# Patient Record
Sex: Male | Born: 1965 | Race: White | Hispanic: No | Marital: Married | Smoking: Never smoker
Health system: Southern US, Community
[De-identification: ages and names within clinical notes are randomized; demographics above are authoritative.]

## PROBLEM LIST (undated history)

## (undated) DIAGNOSIS — C4490 Unspecified malignant neoplasm of skin, unspecified: Secondary | ICD-10-CM

## (undated) DIAGNOSIS — S0990XA Unspecified injury of head, initial encounter: Secondary | ICD-10-CM

## (undated) DIAGNOSIS — I10 Essential (primary) hypertension: Secondary | ICD-10-CM

## (undated) DIAGNOSIS — R51 Headache: Secondary | ICD-10-CM

## (undated) HISTORY — PX: MELANOMA EXCISION: SHX5266

## (undated) HISTORY — PX: ANKLE SURGERY: SHX546

## (undated) HISTORY — PX: ORIF FEMUR FRACTURE: SHX2119

## (undated) HISTORY — DX: Unspecified malignant neoplasm of skin, unspecified: C44.90

## (undated) HISTORY — PX: OTHER SURGICAL HISTORY: SHX169

## (undated) HISTORY — PX: APPENDECTOMY: SHX54

## (undated) HISTORY — PX: MANDIBLE FRACTURE SURGERY: SHX706

## (undated) HISTORY — DX: Headache: R51

## (undated) HISTORY — DX: Essential (primary) hypertension: I10

---

## 1987-09-03 DIAGNOSIS — S0990XA Unspecified injury of head, initial encounter: Secondary | ICD-10-CM

## 1987-09-03 HISTORY — DX: Unspecified injury of head, initial encounter: S09.90XA

## 1998-05-19 ENCOUNTER — Encounter: Payer: Self-pay | Admitting: Family Medicine

## 1998-05-19 ENCOUNTER — Ambulatory Visit (HOSPITAL_COMMUNITY): Admission: RE | Admit: 1998-05-19 | Discharge: 1998-05-19 | Payer: Self-pay | Admitting: Family Medicine

## 2011-01-07 ENCOUNTER — Encounter: Payer: Self-pay | Admitting: Orthopedic Surgery

## 2011-01-29 ENCOUNTER — Encounter: Payer: Self-pay | Admitting: Cardiology

## 2011-01-30 ENCOUNTER — Encounter: Payer: Self-pay | Admitting: Cardiology

## 2011-01-30 ENCOUNTER — Ambulatory Visit (INDEPENDENT_AMBULATORY_CARE_PROVIDER_SITE_OTHER): Payer: BC Managed Care – PPO | Admitting: Cardiology

## 2011-01-30 DIAGNOSIS — L989 Disorder of the skin and subcutaneous tissue, unspecified: Secondary | ICD-10-CM | POA: Insufficient documentation

## 2011-01-30 DIAGNOSIS — I10 Essential (primary) hypertension: Secondary | ICD-10-CM

## 2011-01-30 NOTE — Patient Instructions (Signed)
We will check blood work on you today.  We will schedule you for an Echocardiogram ( ultrasound test of the heart).

## 2011-01-30 NOTE — Assessment & Plan Note (Signed)
His examination today is fairly unremarkable. There may be what could be considered a splinter hemorrhage on the right middle index finger. He has no constitutional symptoms to suggest endocarditis. We will perform basic lab work today including a chemistry panel, CBC, sedimentation rate, and ANA. We will also schedule him for an echocardiogram. I will plan on seeing him back in 3 weeks for followup. If the above studies are negative we will follow him for any recurrent lesions. At this point my clinic clinical suspicion is not high enough to consider a TEE.

## 2011-01-30 NOTE — Progress Notes (Signed)
   Marcus Miles Date of Birth: [birth year]   History of Present Illness: Marcus Miles is seen at the request of Dr. Merlyn Lot for evaluation of possible cardioembolic lesions on the hand. Patient reports that approximately 6 weeks ago he noted a lesion on his right thumb that subsequently turned black. Over a three-week period he had several more lesions on his thumb and finger of a similar appearance. These subsequently peeled off. On further evaluation he was noted to have what appeared to be a splinter hemorrhage on his right middle finger. There was some concern that these lesions could be cardioembolic. Patient has had no fever or chills. He's had no recent illnesses. In general he has been in good health. He does have a history of hypertension. 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  Current Outpatient Prescriptions on File Prior to Visit  Medication Sig Dispense Refill  . aspirin 81 MG tablet Take 81 mg by mouth daily.        Marland Kitchen lisinopril-hydrochlorothiazide (PRINZIDE,ZESTORETIC) 20-12.5 MG per tablet Take 2 tablets by mouth daily.        . metoprolol (TOPROL-XL) 200 MG 24 hr tablet Take 200 mg by mouth daily.        . Naproxen Sodium (ALEVE PO) Take by mouth daily.          No Known Allergies  Past Medical History  Diagnosis Date  . Hypertension   . Headache   . Skin cancer     Past Surgical History  Procedure Date  . Orif femur fracture   . Ankle surgery   . Mandible fracture surgery   . Melanoma excision     History  Smoking status  . Never Smoker   Smokeless tobacco  . Never Used    History  Alcohol Use No    Family History  Problem Relation Age of Onset  . Diabetes    . Hypertension    . Arthritis      Review of Systems: The review of systems is positive for skin lesions as noted.  He has no arthralgias or rashes otherwise. He denies any chest pain, shortness of breath, or palpitations.All other systems were  reviewed and are negative.  Physical Exam: BP 152/100  Pulse 49  Ht 5\' 9"  (1.753 m)  Wt 249 lb 8 oz (113.172 kg)  BMI 36.84 kg/m2 He is a pleasant overweight white male in no acute distress. He is normocephalic, atraumatic. Pupils are equal round and reactive to light and accommodation. Extraocular movements are full. Oropharynx is clear. Neck is supple without JVD, adenopathy, thyromegaly, or bruits. Sclerae  are clear.lungs are clear. Cardiac exam reveals a regular rate and rhythm without gallop, murmur, or click. Abdomen is soft and nontender without hepatosplenomegaly or masses. Extremities are without edema. Examination of his hands show resolution of the previously described lesions except for a tiny pinpoint like spot on the tip of his right middle finger. There is also a single lesion beneath his right middle nail which is a brown linear mark. LABORATORY DATA: ECG today is normal.  Assessment / Plan:

## 2011-01-31 LAB — CBC WITH DIFFERENTIAL/PLATELET
Basophils Absolute: 0 10*3/uL (ref 0.0–0.1)
Basophils Relative: 0.5 % (ref 0.0–3.0)
Eosinophils Absolute: 0.2 10*3/uL (ref 0.0–0.7)
Eosinophils Relative: 3.5 % (ref 0.0–5.0)
HCT: 42.5 % (ref 39.0–52.0)
Hemoglobin: 14.3 g/dL (ref 13.0–17.0)
Lymphocytes Relative: 29.4 % (ref 12.0–46.0)
Lymphs Abs: 2 10*3/uL (ref 0.7–4.0)
MCHC: 33.6 g/dL (ref 30.0–36.0)
MCV: 89.1 fl (ref 78.0–100.0)
Monocytes Absolute: 0.7 10*3/uL (ref 0.1–1.0)
Monocytes Relative: 9.9 % (ref 3.0–12.0)
Neutro Abs: 3.9 10*3/uL (ref 1.4–7.7)
Neutrophils Relative %: 56.7 % (ref 43.0–77.0)
Platelets: 165 10*3/uL (ref 150.0–400.0)
RBC: 4.77 Mil/uL (ref 4.22–5.81)
RDW: 13.3 % (ref 11.5–14.6)
WBC: 6.8 10*3/uL (ref 4.5–10.5)

## 2011-02-01 ENCOUNTER — Other Ambulatory Visit (INDEPENDENT_AMBULATORY_CARE_PROVIDER_SITE_OTHER): Payer: BC Managed Care – PPO | Admitting: *Deleted

## 2011-02-01 DIAGNOSIS — L989 Disorder of the skin and subcutaneous tissue, unspecified: Secondary | ICD-10-CM

## 2011-02-01 DIAGNOSIS — I10 Essential (primary) hypertension: Secondary | ICD-10-CM

## 2011-02-01 DIAGNOSIS — Z79899 Other long term (current) drug therapy: Secondary | ICD-10-CM

## 2011-02-01 LAB — BASIC METABOLIC PANEL
BUN: 11 mg/dL (ref 6–23)
Potassium: 3.8 mEq/L (ref 3.5–5.3)
Sodium: 139 mEq/L (ref 135–145)

## 2011-02-01 LAB — HEPATIC FUNCTION PANEL
AST: 22 U/L (ref 0–37)
Bilirubin, Direct: 0.1 mg/dL (ref 0.0–0.3)
Total Bilirubin: 0.5 mg/dL (ref 0.3–1.2)

## 2011-02-04 LAB — ANA: Anti Nuclear Antibody(ANA): NEGATIVE

## 2011-02-08 ENCOUNTER — Ambulatory Visit (HOSPITAL_COMMUNITY): Payer: BC Managed Care – PPO | Attending: Cardiology | Admitting: Radiology

## 2011-02-08 ENCOUNTER — Telehealth: Payer: Self-pay | Admitting: *Deleted

## 2011-02-08 DIAGNOSIS — E669 Obesity, unspecified: Secondary | ICD-10-CM | POA: Insufficient documentation

## 2011-02-08 DIAGNOSIS — I079 Rheumatic tricuspid valve disease, unspecified: Secondary | ICD-10-CM | POA: Insufficient documentation

## 2011-02-08 DIAGNOSIS — I749 Embolism and thrombosis of unspecified artery: Secondary | ICD-10-CM | POA: Insufficient documentation

## 2011-02-08 DIAGNOSIS — I10 Essential (primary) hypertension: Secondary | ICD-10-CM | POA: Insufficient documentation

## 2011-02-08 DIAGNOSIS — G459 Transient cerebral ischemic attack, unspecified: Secondary | ICD-10-CM

## 2011-02-08 NOTE — Telephone Encounter (Signed)
Message copied by Lorayne Bender on Fri Feb 08, 2011  2:30 PM ------      Message from: [place], PETER M      Created: Wed Feb 06, 2011  5:27 PM       ANA and chemistries are all normal. Please report.

## 2011-02-08 NOTE — Telephone Encounter (Signed)
Lm w/ lab results. Advised to call back

## 2011-02-11 ENCOUNTER — Telehealth: Payer: Self-pay | Admitting: *Deleted

## 2011-02-11 NOTE — Telephone Encounter (Signed)
Notified of Echo and all lab results. States he is feeling better and canc app in f/u w/ Dr. [place]

## 2011-02-11 NOTE — Telephone Encounter (Signed)
Message copied by Lorayne Bender on Mon Feb 11, 2011  4:46 PM ------      Message from: [place], PETER M      Created: Sat Feb 09, 2011  5:10 PM       Please report LV function is still good. 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

## 2011-02-21 ENCOUNTER — Ambulatory Visit: Payer: BC Managed Care – PPO | Admitting: Cardiology

## 2011-09-06 ENCOUNTER — Ambulatory Visit (INDEPENDENT_AMBULATORY_CARE_PROVIDER_SITE_OTHER): Payer: BC Managed Care – PPO | Admitting: General Surgery

## 2011-09-30 ENCOUNTER — Ambulatory Visit (INDEPENDENT_AMBULATORY_CARE_PROVIDER_SITE_OTHER): Payer: BC Managed Care – PPO | Admitting: General Surgery

## 2011-09-30 ENCOUNTER — Encounter (INDEPENDENT_AMBULATORY_CARE_PROVIDER_SITE_OTHER): Payer: Self-pay | Admitting: General Surgery

## 2011-09-30 DIAGNOSIS — E669 Obesity, unspecified: Secondary | ICD-10-CM | POA: Insufficient documentation
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
## 2011-09-30 NOTE — Patient Instructions (Signed)
We will start your work-up for gastric bypass surgery

## 2011-10-01 LAB — CBC WITH DIFFERENTIAL/PLATELET
HCT: 44.5 % (ref 39.0–52.0)
Hemoglobin: 14.2 g/dL (ref 13.0–17.0)
Lymphocytes Relative: 26 % (ref 12–46)
Lymphs Abs: 2 10*3/uL (ref 0.7–4.0)
MCHC: 31.9 g/dL (ref 30.0–36.0)
Monocytes Absolute: 0.7 10*3/uL (ref 0.1–1.0)
Monocytes Relative: 9 % (ref 3–12)
Neutro Abs: 4.8 10*3/uL (ref 1.7–7.7)
RBC: 4.99 MIL/uL (ref 4.22–5.81)

## 2011-10-01 LAB — COMPREHENSIVE METABOLIC PANEL
Albumin: 4.7 g/dL (ref 3.5–5.2)
BUN: 14 mg/dL (ref 6–23)
CO2: 33 mEq/L — ABNORMAL HIGH (ref 19–32)
Calcium: 9.2 mg/dL (ref 8.4–10.5)
Chloride: 100 mEq/L (ref 96–112)
Glucose, Bld: 101 mg/dL — ABNORMAL HIGH (ref 70–99)
Potassium: 4.5 mEq/L (ref 3.5–5.3)

## 2011-10-01 LAB — LIPID PANEL
HDL: 40 mg/dL (ref >39)
Triglycerides: 148 mg/dL (ref <150)

## 2011-10-10 ENCOUNTER — Ambulatory Visit (HOSPITAL_COMMUNITY)
Admission: RE | Admit: 2011-10-10 | Discharge: 2011-10-10 | Disposition: A | Discharge status: Home / Self Care | Payer: BC Managed Care – PPO | Source: Ambulatory Visit | Attending: General Surgery | Admitting: General Surgery

## 2011-10-10 ENCOUNTER — Other Ambulatory Visit: Payer: Self-pay

## 2011-10-10 DIAGNOSIS — I10 Essential (primary) hypertension: Secondary | ICD-10-CM | POA: Insufficient documentation

## 2011-10-10 DIAGNOSIS — K8020 Calculus of gallbladder without cholecystitis without obstruction: Secondary | ICD-10-CM | POA: Insufficient documentation

## 2011-10-10 DIAGNOSIS — R059 Cough, unspecified: Secondary | ICD-10-CM | POA: Insufficient documentation

## 2011-10-10 DIAGNOSIS — Z6838 Body mass index (BMI) 38.0-38.9, adult: Secondary | ICD-10-CM | POA: Insufficient documentation

## 2011-10-10 DIAGNOSIS — K7689 Other specified diseases of liver: Secondary | ICD-10-CM | POA: Insufficient documentation

## 2011-10-10 DIAGNOSIS — R05 Cough: Secondary | ICD-10-CM | POA: Insufficient documentation

## 2011-10-14 ENCOUNTER — Ambulatory Visit: Payer: BC Managed Care – PPO | Admitting: *Deleted

## 2011-10-15 ENCOUNTER — Ambulatory Visit (HOSPITAL_COMMUNITY): Payer: BC Managed Care – PPO

## 2011-10-21 ENCOUNTER — Ambulatory Visit: Payer: BC Managed Care – PPO | Admitting: *Deleted

## 2011-10-21 ENCOUNTER — Encounter (HOSPITAL_COMMUNITY): Admission: RE | Payer: Self-pay | Source: Ambulatory Visit

## 2011-10-21 ENCOUNTER — Ambulatory Visit (HOSPITAL_COMMUNITY): Admission: RE | Admit: 2011-10-21 | Payer: BC Managed Care – PPO | Source: Ambulatory Visit | Admitting: General Surgery

## 2011-10-21 SURGERY — BREATH TEST, FOR HELICOBACTER PYLORI

## 2011-11-06 ENCOUNTER — Encounter: Payer: Self-pay | Admitting: *Deleted

## 2011-11-06 ENCOUNTER — Encounter: Payer: BC Managed Care – PPO | Attending: General Surgery | Admitting: *Deleted

## 2011-11-06 DIAGNOSIS — Z713 Dietary counseling and surveillance: Secondary | ICD-10-CM | POA: Insufficient documentation

## 2011-11-06 DIAGNOSIS — Z01818 Encounter for other preprocedural examination: Secondary | ICD-10-CM | POA: Insufficient documentation

## 2011-11-06 NOTE — Patient Instructions (Addendum)
   Follow Pre-Op Nutrition Goals to prepare for Gastric Bypass Surgery.   Call the [facility] at [phone] once you have been given your surgery date to enrolled in the Pre-Op Nutrition Class. You will need to attend this nutrition class 3-4 weeks prior to your surgery. 

## 2011-11-06 NOTE — Progress Notes (Signed)
  Pre-Op Assessment Visit:  Pre-Operative Gastric Bypass Surgery  Medical Nutrition Therapy:  Appt start time: 1600 end time:  1700.  Patient was seen on 11/06/2011 for Pre-Operative Gastric Bypass/RNY Nutrition Assessment. Assessment and letter of approval faxed to [facility] Bariatric Surgery Program coordinator on 11/07/11.  Approval letter sent to [facility] center and will be available in the chart under the media tab.  Handouts given during visit include:  Pre-Op Goals Handout  Patient to call for Pre-Op and Post-Op Nutrition Education at the [facility] when surgery is scheduled.

## 2011-11-27 ENCOUNTER — Other Ambulatory Visit (INDEPENDENT_AMBULATORY_CARE_PROVIDER_SITE_OTHER): Payer: Self-pay | Admitting: General Surgery

## 2011-12-11 ENCOUNTER — Ambulatory Visit (HOSPITAL_COMMUNITY)
Admission: RE | Admit: 2011-12-11 | Discharge: 2011-12-11 | Disposition: A | Discharge status: Home Health | Payer: BC Managed Care – PPO | Source: Ambulatory Visit | Attending: General Surgery | Admitting: General Surgery

## 2011-12-11 ENCOUNTER — Encounter (HOSPITAL_COMMUNITY)
Admission: RE | Disposition: A | Discharge status: Home Health | Payer: Self-pay | Source: Ambulatory Visit | Attending: General Surgery

## 2011-12-11 HISTORY — PX: BREATH TEK H PYLORI: SHX5422

## 2011-12-11 SURGERY — BREATH TEST, FOR HELICOBACTER PYLORI

## 2011-12-13 ENCOUNTER — Encounter (HOSPITAL_COMMUNITY): Payer: Self-pay | Admitting: General Surgery

## 2012-06-18 ENCOUNTER — Other Ambulatory Visit (INDEPENDENT_AMBULATORY_CARE_PROVIDER_SITE_OTHER): Payer: Self-pay | Admitting: General Surgery

## 2012-06-19 ENCOUNTER — Telehealth (INDEPENDENT_AMBULATORY_CARE_PROVIDER_SITE_OTHER): Payer: Self-pay | Admitting: General Surgery

## 2012-06-19 NOTE — Telephone Encounter (Signed)
The patient called back and I told him the message that [facility] had left.  I told him to stop Aspirin and Aleve 7 days prior.  He states he has a surgery date of 12/16.  Please call if there is any further message.

## 2012-06-19 NOTE — Telephone Encounter (Signed)
Calling to make patient aware Dr Andrey Campanile has filled out orders for his gastric bypass surgery and for him to stop aspirin/blood thinners for 7 days prior. Left message on machine for patient to call back and ask for me.

## 2012-07-23 ENCOUNTER — Ambulatory Visit (INDEPENDENT_AMBULATORY_CARE_PROVIDER_SITE_OTHER): Payer: BC Managed Care – PPO | Admitting: General Surgery

## 2012-08-06 ENCOUNTER — Encounter: Payer: Self-pay | Admitting: *Deleted

## 2012-08-06 ENCOUNTER — Encounter: Payer: BC Managed Care – PPO | Attending: General Surgery | Admitting: *Deleted

## 2012-08-06 VITALS — Ht 69.0 in | Wt 257.7 lb

## 2012-08-06 DIAGNOSIS — E669 Obesity, unspecified: Secondary | ICD-10-CM | POA: Insufficient documentation

## 2012-08-06 DIAGNOSIS — Z01818 Encounter for other preprocedural examination: Secondary | ICD-10-CM | POA: Insufficient documentation

## 2012-08-06 DIAGNOSIS — Z713 Dietary counseling and surveillance: Secondary | ICD-10-CM | POA: Insufficient documentation

## 2012-08-06 NOTE — Patient Instructions (Signed)
Follow:   Pre-Op Diet per MD 2 weeks prior to surgery  Phase 2- Liquids (clear/full) 2 weeks after surgery  Vitamin/Mineral/Calcium guidelines for purchasing bariatric supplements  Exercise guidelines pre and post-op per MD  Follow-up at [facility] in 2 weeks post-op for diet advancement. Contact Syrena Burges as needed with questions/concerns. 

## 2012-08-06 NOTE — Progress Notes (Signed)
Bariatric Class:  Appt start time: 1730 end time:  1830.  Pre-Operative Nutrition Class  Patient was seen on 08/06/2012 for Pre-Operative Bariatric Surgery Education at [facility].   Surgery date: 08/17/12 Surgery type: RYGB Start weight at [facility]: 258.4 lbs (11/06/11) Goal weight: 190 lbs  Weight today: 257.7 lbs BMI: 38.1  Samples given per MNT protocol: Bariatric Advantage Multivitamin Lot # [id number];  Exp: 06/15  Bariatric Advantage Calcium Citrate Lot # [id number];  Exp: 12/13 (*Pt alerted to upcoming expiration date)  Celebrate Vitamins Multivitamin Lot # [id number];  Exp: 07/14  Celebrate Vitamins Iron + C (18 mg) Lot # [id number];  Exp: 03/15  Celebrate Vitamins Calcium Citrate Lot # [id number];  Exp: 08/15  Corliss Marcus Protein Shake Lot # [id number];  Exp: 02/15  Premier Protein Shake Lot # [id number];  Exp: 06/20/13  The following the learning objective met by the patient during this course:  Identifies Pre-Op Dietary Goals and will begin 2 weeks pre-operatively  Identifies appropriate sources of fluids and proteins   States protein recommendations and appropriate sources pre and post-operatively  Identifies Post-Operative Dietary Goals and will follow for 2 weeks post-operatively  Identifies appropriate multivitamin and calcium sources  Describes the need for physical activity post-operatively and will follow MD recommendations  States when to call healthcare provider regarding medication questions or post-operative complications  Handouts given during class include:  Pre-Op Bariatric Surgery Diet Handout  Protein Shake Handout  Post-Op Bariatric Surgery Nutrition Handout  Support Group Information Flyer  [facility] Bariatric Supplements Price List  Follow-Up Plan: Patient will follow-up at [facility] 2 weeks post operatively for diet advancement per MD.

## 2012-08-07 ENCOUNTER — Encounter (HOSPITAL_COMMUNITY): Payer: Self-pay | Admitting: Pharmacy Technician

## 2012-08-12 NOTE — Progress Notes (Signed)
Chest x ray  2/13 EPIC, EKG 1/13 EPIC

## 2012-08-12 NOTE — Patient Instructions (Addendum)
20 Marcus Miles  08/12/2012   Your procedure is scheduled on: 08/17/12  MONDAY    Report to [facility] at   0515    AM.  Call this number if you have problems the morning of surgery: [phone]       Remember:   Do not eat food  Or drink :After Midnight.   Take these medicines the morning of surgery with A SIP OF WATER:metoprolol   .  Contacts, dentures or partial plates can not be worn to surgery  Leave suitcase in the car. After surgery it may be brought to your room.  For patients admitted to the hospital, checkout time is 11:00 AM day of  discharge.             SPECIAL INSTRUCTIONS- SEE [facility] PREPARING FOR SURGERY INSTRUCTION SHEET-     DO NOT WEAR JEWELRY, LOTIONS, POWDERS, OR PERFUMES.  WOMEN-- DO NOT SHAVE LEGS OR UNDERARMS FOR 12 HOURS BEFORE SHOWERS. MEN MAY SHAVE FACE.  Patients discharged the day of surgery will not be allowed to drive home. IF going home the day of surgery, you must have a driver and someone to stay with you for the first 24 hours  Name and phone number of your driver:  admission                                                                      Please read over the following fact sheets that you were given: MRSA Information, Incentive Spirometry Sheet, Blood Transfusion Sheet  Information                                                                                   Cain Sieve RN  [facility]ST phone number [phone]                 FAILURE TO FOLLOW THESE INSTRUCTIONS MAY RESULT IN  CANCELLATION   OF YOUR SURGERY                                                  Patient Signature _____________________________

## 2012-08-13 ENCOUNTER — Ambulatory Visit (INDEPENDENT_AMBULATORY_CARE_PROVIDER_SITE_OTHER): Payer: BC Managed Care – PPO | Admitting: General Surgery

## 2012-08-13 ENCOUNTER — Encounter (INDEPENDENT_AMBULATORY_CARE_PROVIDER_SITE_OTHER): Payer: Self-pay | Admitting: General Surgery

## 2012-08-13 ENCOUNTER — Inpatient Hospital Stay (HOSPITAL_COMMUNITY): Admission: RE | Admit: 2012-08-13 | Payer: BC Managed Care – PPO | Source: Ambulatory Visit

## 2012-08-13 ENCOUNTER — Encounter (HOSPITAL_COMMUNITY)
Admission: RE | Admit: 2012-08-13 | Discharge: 2012-08-13 | Disposition: A | Discharge status: Home / Self Care | Payer: BC Managed Care – PPO | Source: Ambulatory Visit | Attending: General Surgery | Admitting: General Surgery

## 2012-08-13 ENCOUNTER — Encounter (HOSPITAL_COMMUNITY): Payer: Self-pay

## 2012-08-13 VITALS — BP 138/66 | HR 64 | Temp 97.2°F | Resp 16 | Ht 69.5 in | Wt 254.2 lb

## 2012-08-13 DIAGNOSIS — Z6836 Body mass index (BMI) 36.0-36.9, adult: Secondary | ICD-10-CM

## 2012-08-13 DIAGNOSIS — E669 Obesity, unspecified: Secondary | ICD-10-CM

## 2012-08-13 DIAGNOSIS — I10 Essential (primary) hypertension: Secondary | ICD-10-CM

## 2012-08-13 DIAGNOSIS — R51 Headache: Secondary | ICD-10-CM

## 2012-08-13 HISTORY — DX: Unspecified injury of head, initial encounter: S09.90XA

## 2012-08-13 LAB — COMPREHENSIVE METABOLIC PANEL
ALT: 30 U/L (ref 0–53)
AST: 26 U/L (ref 0–37)
Alkaline Phosphatase: 58 U/L (ref 39–117)
CO2: 30 mEq/L (ref 19–32)
Chloride: 100 mEq/L (ref 96–112)
GFR calc non Af Amer: >90 mL/min (ref >90)
Glucose, Bld: 92 mg/dL (ref 70–99)
Potassium: 3.9 mEq/L (ref 3.5–5.1)
Sodium: 140 mEq/L (ref 135–145)
Total Bilirubin: 0.4 mg/dL (ref 0.3–1.2)

## 2012-08-13 LAB — CBC WITH DIFFERENTIAL/PLATELET
Basophils Absolute: 0 10*3/uL (ref 0.0–0.1)
Lymphocytes Relative: 28 % (ref 12–46)
Lymphs Abs: 2.2 10*3/uL (ref 0.7–4.0)
Neutro Abs: 4.5 10*3/uL (ref 1.7–7.7)
Platelets: 184 10*3/uL (ref 150–400)
RBC: 4.88 MIL/uL (ref 4.22–5.81)
RDW: 12.9 % (ref 11.5–15.5)
WBC: 7.7 10*3/uL (ref 4.0–10.5)

## 2012-08-13 MED ORDER — PEG-KCL-NACL-NASULF-NA ASC-C 100 G PO SOLR: 1.0000 | Freq: Once | ORAL | Status: DC

## 2012-08-13 NOTE — Patient Instructions (Signed)
Pick up bowel prep from pharmacy - call if there is a problem with it
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
## 2012-08-17 ENCOUNTER — Encounter (HOSPITAL_COMMUNITY)
Admission: RE | Disposition: A | Discharge status: Home / Self Care | Payer: Self-pay | Source: Ambulatory Visit | Attending: General Surgery

## 2012-08-17 ENCOUNTER — Encounter (HOSPITAL_COMMUNITY): Payer: Self-pay | Admitting: Anesthesiology

## 2012-08-17 ENCOUNTER — Inpatient Hospital Stay (HOSPITAL_COMMUNITY)
Admission: RE | Admit: 2012-08-17 | Discharge: 2012-08-19 | DRG: 288 | Disposition: A | Discharge status: Home / Self Care | Payer: BC Managed Care – PPO | Source: Ambulatory Visit | Attending: General Surgery | Admitting: General Surgery

## 2012-08-17 ENCOUNTER — Encounter (HOSPITAL_COMMUNITY): Payer: Self-pay | Admitting: Orthopedic Surgery

## 2012-08-17 ENCOUNTER — Inpatient Hospital Stay (HOSPITAL_COMMUNITY): Payer: BC Managed Care – PPO | Admitting: Anesthesiology

## 2012-08-17 ENCOUNTER — Encounter (HOSPITAL_COMMUNITY): Payer: Self-pay

## 2012-08-17 DIAGNOSIS — Z9884 Bariatric surgery status: Secondary | ICD-10-CM

## 2012-08-17 DIAGNOSIS — Z6836 Body mass index (BMI) 36.0-36.9, adult: Secondary | ICD-10-CM

## 2012-08-17 DIAGNOSIS — Z6837 Body mass index (BMI) 37.0-37.9, adult: Secondary | ICD-10-CM

## 2012-08-17 DIAGNOSIS — I10 Essential (primary) hypertension: Secondary | ICD-10-CM | POA: Diagnosis present

## 2012-08-17 DIAGNOSIS — Z01812 Encounter for preprocedural laboratory examination: Secondary | ICD-10-CM

## 2012-08-17 DIAGNOSIS — E7800 Pure hypercholesterolemia, unspecified: Secondary | ICD-10-CM | POA: Diagnosis present

## 2012-08-17 DIAGNOSIS — G4733 Obstructive sleep apnea (adult) (pediatric): Secondary | ICD-10-CM

## 2012-08-17 DIAGNOSIS — K7689 Other specified diseases of liver: Secondary | ICD-10-CM | POA: Diagnosis present

## 2012-08-17 DIAGNOSIS — E669 Obesity, unspecified: Secondary | ICD-10-CM | POA: Diagnosis present

## 2012-08-17 DIAGNOSIS — Z79899 Other long term (current) drug therapy: Secondary | ICD-10-CM

## 2012-08-17 HISTORY — PX: UPPER GI ENDOSCOPY: SHX6162

## 2012-08-17 HISTORY — PX: GASTRIC ROUX-EN-Y: SHX5262

## 2012-08-17 LAB — CBC
HCT: 40.8 % (ref 39.0–52.0)
Hemoglobin: 13.7 g/dL (ref 13.0–17.0)
MCV: 86.3 fL (ref 78.0–100.0)
RBC: 4.73 MIL/uL (ref 4.22–5.81)
RDW: 12.8 % (ref 11.5–15.5)
WBC: 10.3 10*3/uL (ref 4.0–10.5)

## 2012-08-17 LAB — CREATININE, SERUM
GFR calc Af Amer: >90 mL/min (ref >90)
GFR calc non Af Amer: 83 mL/min — ABNORMAL LOW (ref >90)

## 2012-08-17 SURGERY — LAPAROSCOPIC ROUX-EN-Y GASTRIC
Anesthesia: General | Site: Abdomen | Wound class: Clean Contaminated

## 2012-08-17 MED ORDER — NEOSTIGMINE METHYLSULFATE 1 MG/ML IJ SOLN
INTRAMUSCULAR | Status: DC | PRN
  Administered 2012-08-17: 1 mg via INTRAVENOUS

## 2012-08-17 MED ORDER — FENTANYL CITRATE 0.05 MG/ML IJ SOLN
INTRAMUSCULAR | Status: DC | PRN
  Administered 2012-08-17 (×3): 50 ug via INTRAVENOUS
  Administered 2012-08-17: 100 ug via INTRAVENOUS

## 2012-08-17 MED ORDER — MEPERIDINE HCL 50 MG/ML IJ SOLN: 6.2500 mg | INTRAMUSCULAR | Status: DC | PRN

## 2012-08-17 MED ORDER — HYDROMORPHONE HCL PF 1 MG/ML IJ SOLN
0.2500 mg | INTRAMUSCULAR | Status: DC | PRN
  Administered 2012-08-17 (×2): 0.5 mg via INTRAVENOUS

## 2012-08-17 MED ORDER — PROPOFOL 10 MG/ML IV BOLUS
INTRAVENOUS | Status: DC | PRN
  Administered 2012-08-17: 200 mg via INTRAVENOUS

## 2012-08-17 MED ORDER — OXYCODONE HCL 5 MG/5ML PO SOLN
5.0000 mg | Freq: Once | ORAL | Status: DC | PRN
  Filled 2012-08-17: qty 5

## 2012-08-17 MED ORDER — INFLUENZA VIRUS VACC SPLIT PF IM SUSP
0.5000 mL | INTRAMUSCULAR | Status: AC
  Administered 2012-08-19: 0.5 mL via INTRAMUSCULAR
  Filled 2012-08-17: qty 0.5

## 2012-08-17 MED ORDER — ONDANSETRON HCL 4 MG/2ML IJ SOLN
4.0000 mg | INTRAMUSCULAR | Status: DC | PRN
  Administered 2012-08-17 – 2012-08-18 (×2): 4 mg via INTRAVENOUS
  Filled 2012-08-17 (×2): qty 2

## 2012-08-17 MED ORDER — GLYCOPYRROLATE 0.2 MG/ML IJ SOLN
INTRAMUSCULAR | Status: DC | PRN
  Administered 2012-08-17: .2 mg via INTRAVENOUS

## 2012-08-17 MED ORDER — ACETAMINOPHEN 160 MG/5ML PO SOLN: 650.0000 mg | ORAL | Status: DC | PRN

## 2012-08-17 MED ORDER — LIDOCAINE HCL (CARDIAC) 20 MG/ML IV SOLN
INTRAVENOUS | Status: DC | PRN
  Administered 2012-08-17: 100 mg via INTRAVENOUS

## 2012-08-17 MED ORDER — UNJURY VANILLA POWDER
2.0000 [oz_av] | Freq: Four times a day (QID) | ORAL | Status: DC
  Administered 2012-08-19: 2 [oz_av] via ORAL

## 2012-08-17 MED ORDER — ACETAMINOPHEN 10 MG/ML IV SOLN: 1000.0000 mg | Freq: Once | INTRAVENOUS | Status: DC | PRN

## 2012-08-17 MED ORDER — ACETAMINOPHEN 10 MG/ML IV SOLN
1000.0000 mg | Freq: Four times a day (QID) | INTRAVENOUS | Status: AC
  Administered 2012-08-17 – 2012-08-18 (×4): 1000 mg via INTRAVENOUS
  Filled 2012-08-17 (×6): qty 100

## 2012-08-17 MED ORDER — OXYCODONE-ACETAMINOPHEN 5-325 MG/5ML PO SOLN
5.0000 mL | ORAL | Status: DC | PRN
  Administered 2012-08-18 – 2012-08-19 (×4): 10 mL via ORAL
  Filled 2012-08-17 (×4): qty 10

## 2012-08-17 MED ORDER — PROMETHAZINE HCL 25 MG/ML IJ SOLN
6.2500 mg | INTRAMUSCULAR | Status: DC | PRN
  Administered 2012-08-17: 6.25 mg via INTRAVENOUS

## 2012-08-17 MED ORDER — UNJURY CHICKEN SOUP POWDER: 2.0000 [oz_av] | Freq: Four times a day (QID) | ORAL | Status: DC

## 2012-08-17 MED ORDER — MORPHINE SULFATE 2 MG/ML IJ SOLN
2.0000 mg | INTRAMUSCULAR | Status: DC | PRN
  Administered 2012-08-17 – 2012-08-18 (×7): 2 mg via INTRAVENOUS
  Filled 2012-08-17 (×7): qty 1

## 2012-08-17 MED ORDER — UNJURY CHOCOLATE CLASSIC POWDER
2.0000 [oz_av] | Freq: Four times a day (QID) | ORAL | Status: DC
Start: 2012-08-19 — End: 2012-08-19

## 2012-08-17 MED ORDER — HEPARIN SODIUM (PORCINE) 5000 UNIT/ML IJ SOLN
5000.0000 [IU] | INTRAMUSCULAR | Status: AC
  Administered 2012-08-17: 5000 [IU] via SUBCUTANEOUS
  Filled 2012-08-17: qty 1

## 2012-08-17 MED ORDER — BUPIVACAINE-EPINEPHRINE 0.25% -1:200000 IJ SOLN
INTRAMUSCULAR | Status: DC | PRN
  Administered 2012-08-17: 20 mL

## 2012-08-17 MED ORDER — KCL IN DEXTROSE-NACL 20-5-0.45 MEQ/L-%-% IV SOLN
INTRAVENOUS | Status: DC
  Administered 2012-08-17 – 2012-08-19 (×6): via INTRAVENOUS
  Filled 2012-08-17 (×8): qty 1000

## 2012-08-17 MED ORDER — DEXTROSE 5 % IV SOLN
2.0000 g | INTRAVENOUS | Status: AC
  Administered 2012-08-17: 2 g via INTRAVENOUS
  Filled 2012-08-17: qty 2

## 2012-08-17 MED ORDER — ACETAMINOPHEN 10 MG/ML IV SOLN
INTRAVENOUS | Status: DC | PRN
  Administered 2012-08-17: 1000 mg via INTRAVENOUS

## 2012-08-17 MED ORDER — LACTATED RINGERS IR SOLN
Status: DC | PRN
  Administered 2012-08-17: 3000 mL

## 2012-08-17 MED ORDER — CISATRACURIUM BESYLATE (PF) 10 MG/5ML IV SOLN
INTRAVENOUS | Status: DC | PRN
  Administered 2012-08-17 (×3): 2 mg via INTRAVENOUS
  Administered 2012-08-17: 12 mg via INTRAVENOUS
  Administered 2012-08-17 (×2): 2 mg via INTRAVENOUS

## 2012-08-17 MED ORDER — OXYCODONE HCL 5 MG PO TABS: 5.0000 mg | ORAL_TABLET | Freq: Once | ORAL | Status: DC | PRN

## 2012-08-17 MED ORDER — ENOXAPARIN SODIUM 40 MG/0.4ML ~~LOC~~ SOLN
40.0000 mg | SUBCUTANEOUS | Status: DC
  Administered 2012-08-18: 40 mg via SUBCUTANEOUS
  Filled 2012-08-17 (×3): qty 0.4

## 2012-08-17 MED ORDER — TISSEEL VH 10 ML EX KIT
PACK | CUTANEOUS | Status: DC | PRN
  Administered 2012-08-17 (×2): 10 mL

## 2012-08-17 MED ORDER — EPHEDRINE SULFATE 50 MG/ML IJ SOLN
INTRAMUSCULAR | Status: DC | PRN
  Administered 2012-08-17 (×2): 5 mg via INTRAVENOUS
  Administered 2012-08-17 (×2): 10 mg via INTRAVENOUS

## 2012-08-17 MED ORDER — METOPROLOL TARTRATE 1 MG/ML IV SOLN
5.0000 mg | Freq: Four times a day (QID) | INTRAVENOUS | Status: DC
  Administered 2012-08-17 – 2012-08-19 (×8): 5 mg via INTRAVENOUS
  Filled 2012-08-17 (×12): qty 5

## 2012-08-17 MED ORDER — LACTATED RINGERS IV SOLN
INTRAVENOUS | Status: DC | PRN
  Administered 2012-08-17 (×3): via INTRAVENOUS

## 2012-08-17 MED ORDER — ONDANSETRON HCL 4 MG/2ML IJ SOLN
INTRAMUSCULAR | Status: DC | PRN
  Administered 2012-08-17 (×2): 2 mg via INTRAVENOUS

## 2012-08-17 MED ORDER — MIDAZOLAM HCL 5 MG/5ML IJ SOLN
INTRAMUSCULAR | Status: DC | PRN
  Administered 2012-08-17: 2 mg via INTRAVENOUS

## 2012-08-17 SURGICAL SUPPLY — 88 items
ADH SKN CLS APL DERMABOND .7 (GAUZE/BANDAGES/DRESSINGS) ×2
APL SKNCLS STERI-STRIP NONHPOA (GAUZE/BANDAGES/DRESSINGS)
APL SRG 32X5 SNPLK LF DISP (MISCELLANEOUS) ×2
APPLICATOR COTTON TIP 6IN STRL (MISCELLANEOUS) ×6 IMPLANT
APPLIER CLIP ROT 13.4 12 LRG (CLIP)
APR CLP LRG 13.4X12 ROT 20 MLT (CLIP)
BAG SPEC RTRVL LRG 6X4 10 (ENDOMECHANICALS)
BENZOIN TINCTURE PRP APPL 2/3 (GAUZE/BANDAGES/DRESSINGS) IMPLANT
BLADE SURG 15 STRL LF DISP TIS (BLADE) IMPLANT
BLADE SURG 15 STRL SS (BLADE) ×3
BLADE SURG SZ11 CARB STEEL (BLADE) ×3 IMPLANT
CABLE HIGH FREQUENCY MONO STRZ (ELECTRODE) ×3 IMPLANT
CANISTER SUCTION 2500CC (MISCELLANEOUS) ×3 IMPLANT
CLIP APPLIE ROT 13.4 12 LRG (CLIP) IMPLANT
CLIP SUT LAPRA TY ABSORB (SUTURE) ×6 IMPLANT
CLOTH BEACON ORANGE TIMEOUT ST (SAFETY) ×3 IMPLANT
COVER SURGICAL LIGHT HANDLE (MISCELLANEOUS) ×3 IMPLANT
CUTTER LINEAR ENDO ART 45 ETS (STAPLE) ×1 IMPLANT
DERMABOND ADVANCED (GAUZE/BANDAGES/DRESSINGS) ×1
DERMABOND ADVANCED .7 DNX12 (GAUZE/BANDAGES/DRESSINGS) IMPLANT
DEVICE SUTURE ENDOST 10MM (ENDOMECHANICALS) ×3 IMPLANT
DISSECTOR BLUNT TIP ENDO 5MM (MISCELLANEOUS) IMPLANT
DRAIN PENROSE 18X1/4 LTX STRL (WOUND CARE) ×3 IMPLANT
DRAPE CAMERA CLOSED 9X96 (DRAPES) ×3 IMPLANT
DRAPE UTILITY XL STRL (DRAPES) ×3 IMPLANT
ELECT REM PT RETURN 9FT ADLT (ELECTROSURGICAL) ×3
ELECTRODE REM PT RTRN 9FT ADLT (ELECTROSURGICAL) ×2 IMPLANT
GAUZE SPONGE 4X4 16PLY XRAY LF (GAUZE/BANDAGES/DRESSINGS) ×3 IMPLANT
GLOVE BIO SURGEON STRL SZ7.5 (GLOVE) ×4 IMPLANT
GLOVE BIOGEL M STRL SZ7.5 (GLOVE) IMPLANT
GLOVE BIOGEL PI IND STRL 7.0 (GLOVE) ×2 IMPLANT
GLOVE BIOGEL PI INDICATOR 7.0 (GLOVE) ×1
GLOVE INDICATOR 8.0 STRL GRN (GLOVE) ×3 IMPLANT
GOWN STRL NON-REIN LRG LVL3 (GOWN DISPOSABLE) ×4 IMPLANT
GOWN STRL REIN XL XLG (GOWN DISPOSABLE) ×6 IMPLANT
HEMOSTAT SURGICEL 4X8 (HEMOSTASIS) IMPLANT
HOVERMATT SINGLE USE (MISCELLANEOUS) ×3 IMPLANT
KIT BASIN OR (CUSTOM PROCEDURE TRAY) ×3 IMPLANT
KIT GASTRIC LAVAGE 34FR ADT (SET/KITS/TRAYS/PACK) ×3 IMPLANT
MARKER SKIN DUAL TIP RULER LAB (MISCELLANEOUS) ×3 IMPLANT
NDL SPNL 22GX3.5 QUINCKE BK (NEEDLE) ×2 IMPLANT
NEEDLE SPNL 22GX3.5 QUINCKE BK (NEEDLE) ×3 IMPLANT
NS IRRIG 1000ML POUR BTL (IV SOLUTION) ×3 IMPLANT
PACK CARDIOVASCULAR III (CUSTOM PROCEDURE TRAY) ×3 IMPLANT
POUCH SPECIMEN RETRIEVAL 10MM (ENDOMECHANICALS) IMPLANT
RELOAD 45 VASCULAR/THIN (ENDOMECHANICALS) ×3 IMPLANT
RELOAD BLUE (STAPLE) ×2 IMPLANT
RELOAD ENDO STITCH 2.0 (ENDOMECHANICALS) ×30
RELOAD GOLD (STAPLE) ×1 IMPLANT
RELOAD STAPLE 45 2.5 WHT GRN (ENDOMECHANICALS) IMPLANT
RELOAD STAPLE 45 3.5 BLU ETS (ENDOMECHANICALS) IMPLANT
RELOAD STAPLE TA45 3.5 REG BLU (ENDOMECHANICALS) ×6 IMPLANT
RELOAD SUT SNGL STCH ABSRB 2-0 (ENDOMECHANICALS) IMPLANT
RELOAD SUT SNGL STCH BLK 2-0 (ENDOMECHANICALS) IMPLANT
RELOAD WHITE ECR60W (STAPLE) ×1 IMPLANT
SCALPEL HARMONIC ACE (MISCELLANEOUS) ×3 IMPLANT
SCISSORS LAP 5X35 DISP (ENDOMECHANICALS) ×3 IMPLANT
SEALANT SURGICAL APPL DUAL CAN (MISCELLANEOUS) ×3 IMPLANT
SET IRRIG TUBING LAPAROSCOPIC (IRRIGATION / IRRIGATOR) ×3 IMPLANT
SLEEVE ADV FIXATION 12X100MM (TROCAR) ×6 IMPLANT
SLEEVE ADV FIXATION 5X100MM (TROCAR) ×3 IMPLANT
SLEEVE ENDOPATH XCEL 5M (ENDOMECHANICALS) ×3 IMPLANT
SOLUTION ANTI FOG 6CC (MISCELLANEOUS) ×3 IMPLANT
SPONGE GAUZE 4X4 12PLY (GAUZE/BANDAGES/DRESSINGS) ×3 IMPLANT
STAPLE ECHEON FLEX 60 POW ENDO (STAPLE) ×3 IMPLANT
STAPLER VISISTAT 35W (STAPLE) ×3 IMPLANT
STRIP CLOSURE SKIN 1/2X4 (GAUZE/BANDAGES/DRESSINGS) IMPLANT
SUT ETHILON 3 0 PS 1 (SUTURE) IMPLANT
SUT MNCRL AB 4-0 PS2 18 (SUTURE) ×4 IMPLANT
SUT RELOAD ENDO STITCH 2 48X1 (ENDOMECHANICALS) ×10
SUT RELOAD ENDO STITCH 2.0 (ENDOMECHANICALS) ×10
SUT SILK 2 0 SH (SUTURE) ×2 IMPLANT
SUT VIC AB 2-0 SH 27 (SUTURE) ×3
SUT VIC AB 2-0 SH 27X BRD (SUTURE) ×2 IMPLANT
SUTURE RELOAD END STTCH 2 48X1 (ENDOMECHANICALS) ×10 IMPLANT
SUTURE RELOAD ENDO STITCH 2.0 (ENDOMECHANICALS) ×10 IMPLANT
SYR 20CC LL (SYRINGE) ×3 IMPLANT
SYR 50ML LL SCALE MARK (SYRINGE) ×3 IMPLANT
SYR CONTROL 10ML LL (SYRINGE) ×3 IMPLANT
TOWEL OR 17X26 10 PK STRL BLUE (TOWEL DISPOSABLE) ×3 IMPLANT
TRAY FOLEY CATH 14FRSI W/METER (CATHETERS) ×3 IMPLANT
TROCAR BALLN 12MMX100 BLUNT (TROCAR) ×3 IMPLANT
TROCAR BLADELESS OPT 5 100 (ENDOMECHANICALS) ×4 IMPLANT
TROCAR ENDOPATH XCEL 12X100 BL (ENDOMECHANICALS) ×9 IMPLANT
TROCAR XCEL 12X100 BLDLESS (ENDOMECHANICALS) ×3 IMPLANT
TUBING ENDO SMARTCAP (MISCELLANEOUS) ×3 IMPLANT
TUBING FILTER THERMOFLATOR (ELECTROSURGICAL) ×3 IMPLANT
WATER STERILE IRR 1500ML POUR (IV SOLUTION) ×3 IMPLANT

## 2012-08-17 NOTE — Anesthesia Preprocedure Evaluation (Addendum)
Anesthesia Evaluation  Patient identified by MRN, date of birth, ID band Patient awake    Reviewed: Allergy & Precautions, H&P , NPO status , Patient's Chart, lab work & pertinent test results, reviewed documented beta blocker date and time   Airway Mallampati: III TM Distance: >3 FB Neck ROM: Full    Dental  (+) Dental Advisory Given and Teeth Intact   Pulmonary neg pulmonary ROS,  breath sounds clear to auscultation  Pulmonary exam normal       Cardiovascular hypertension, Pt. on medications and Pt. on home beta blockers Rhythm:Regular Rate:Normal     Neuro/Psych  Headaches, negative psych ROS   GI/Hepatic negative GI ROS, Neg liver ROS,   Endo/Other  negative endocrine ROS  Renal/GU negative Renal ROS     Musculoskeletal  (+) Arthritis -, Osteoarthritis,    Abdominal (+) + obese,   Peds  Hematology negative hematology ROS (+)   Anesthesia Other Findings   Reproductive/Obstetrics                          Anesthesia Physical Anesthesia Plan  ASA: II  Anesthesia Plan: General   Post-op Pain Management:    Induction: Intravenous  Airway Management Planned: Oral ETT  Additional Equipment:   Intra-op Plan:   Post-operative Plan: Extubation in OR  Informed Consent: I have reviewed the patients History and Physical, chart, labs and discussed the procedure including the risks, benefits and alternatives for the proposed anesthesia with the patient or authorized representative who has indicated his/her understanding and acceptance.   Dental advisory given  Plan Discussed with: CRNA  Anesthesia Plan Comments:         Anesthesia Quick Evaluation
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
## 2012-08-17 NOTE — Op Note (Signed)
Preop diagnosis:  1. Morbid obesity (BMI 37)  2. Hypertension 3. OA 4. Elevated cholesterol 5. Fatty liver infiltration  Postop diagnosis:  1. same  Surgical procedure: Laparoscopic Roux-en-Y gastric bypass; upper endoscopy  Surgeon: Atilano Ina, M.D. FACS  Asst.: Ovidio Kin, MD FACS  Anesthesia: General plus 0.25% marcaine with epi  Complications: None   EBL: Minimal   Drains: None   Disposition: PACU in good condition   Indications for procedure: 46yo WM with obesity and co-morbidities who has been unsuccessful at sustained weight loss. The patient's comorbidities are listed above. We discussed the risk and benefits of surgery including but not limited to anesthesia risk, bleeding, infection, blood clot formation, anastomotic leak, anastomotic stricture, ulcer formation, death, respiratory complications, intestinal blockage, internal hernia, gallstone formation, vitamin and nutritional deficiencies, injury to surrounding structures, failure to lose weight and mood changes.   Description of procedure: Patient is brought to the operating room and general anesthesia induced. The patient had received preoperative broad-spectrum IV antibiotics and subcutaneous heparin. The abdomen was widely sterilely prepped with Chloraprep and draped. Patient timeout was performed and correct patient and procedure confirmed. Access was obtained with a 12 mm Optiview trocar in the left upper quadrant and pneumoperitoneum established without difficulty. Under direct vision 12 mm trocars were placed laterally in the right upper quadrant, right upper quadrant midclavicular line, and to the left and above the umbilicus for the camera port. A 5 mm trocar was placed laterally in the left upper quadrant. There were a few omental adhesions in the RLQ which were taken down with Harmonic. The omentum was brought into the upper abdomen and the transverse mesocolon elevated and the ligament of Treitz clearly  identified. A 40 cm biliopancreatic limb was then carefully measured from the ligament of Treitz. The small intestine was divided at this point with a single firing of the white load linear stapler. A Penrose drain was sutured to the end of the Roux-en-Y limb for later identification. A 100 cm Roux-en-Y limb was then carefully measured. At this point a side-to-side anastomosis was created between the Roux limb and the end of the biliopancreatic limb. This was accomplished with a single firing of the 45 mm white load linear stapler. The common enterotomy was closed with a running 2-0 Vicryl begun at either end of the enterotomy and tied centrally. Tisseel was used to coat the anastomosis. The mesenteric defect was then closed with running 2-0 silk. The omentum was then divided with the harmonic scalpel up towards the transverse colon to allow mobility of the Roux limb toward the gastric pouch. The patient was then placed in steep reversed Trendelenburg. Through a 5 mm subxiphoid site the [facility] retractor was placed and the left lobe of the liver elevated with excellent exposure of the upper stomach and hiatus. The angle of Hiss was then mobilized with the harmonic scalpel. A 4 cm gastric pouch was then carefully measured along the lesser curve of the stomach. Dissection was carried along the lesser curve at this point with the Harmonic scalpel working carefully back toward the lesser sac at right angles to the lesser curve. The free lesser sac was then entered. After being sure all tubes were removed from the stomach an initial firing of the gold load 60 mm linear stapler was fired at right angles across the lesser curve for about 4 cm. The gastric pouch was further mobilized posteriorly and then the pouch was completed with 2 further firings of the 60 mm  blue load linear stapler and 1 final firing of a blue load 45mm linear stapler up through the previously dissected angle of His. It was ensured that the pouch  was completely mobilized away from the gastric remnant. This created a nice tubular 4-5 cm gastric pouch.  The Roux limb was then brought up in an antecolic fashion with the candycane facing to the patient's left without undue tension. The gastrojejunostomy was created with an initial posterior row of 2-0 Vicryl between the Roux limb and the staple line of the gastric pouch. Enterotomies were then made in the gastric pouch and the Roux limb with the harmonic scalpel and at approximately 2-2-1/2 cm anastomosis was created with a single firing of the 45mm blue load linear stapler. The staple line was inspected and was intact without bleeding. The common enterotomy was then closed with running 2-0 Vicryl begun at either end and tied centrally. The Ewall tube was then easily passed through the anastomosis and an outer anterior layer of running 2-0 Vicryl was placed. The Ewald tube was removed. With the outlet of the gastrojejunostomy clamped and under saline irrigation the assistant performed upper endoscopy and with the gastric pouch tensely distended with air-There was one bubble seen at the left end of the suture line. The pouch was desufflated and re-insufflated.  There was no evidence of leak on this 2nd test. The pouch was desufflated.  I placed two 2-0 vicryl interrupted sutures on the left end of the suture line.  The Vonita Moss defect was closed with running 2-0 silk. The abdomen was inspected for any evidence of bleeding or bowel injury and everything looked fine. The Nathanson retractor was removed under direct vision after coating the anastomosis with Tisseel tissue sealant. All CO2 was evacuated and trochars removed. Skin incisions were closed with 4-0 Monocryl followed by benzoin & steri-strips and bandages. Sponge needle and instrument counts were correct. The patient was taken to the PACU in good condition.    Mary Sella. Andrey Campanile, MD, FACS General, Bariatric, & Minimally Invasive Surgery [facility]  Surgery, [place]

## 2012-08-17 NOTE — Transfer of Care (Signed)
Immediate Anesthesia Transfer of Care Note  Patient: Marcus Miles  Procedure(s) Performed: Procedure(s) (LRB) with comments: LAPAROSCOPIC ROUX-EN-Y GASTRIC (N/A) UPPER GI ENDOSCOPY ()  Patient Location: PACU  Anesthesia Type:General  Level of Consciousness: awake, alert  and oriented  Airway & Oxygen Therapy: Patient Spontanous Breathing and Patient connected to face mask oxygen  Post-op Assessment: Report given to PACU RN, Post -op Vital signs reviewed and stable and Patient moving all extremities X 4  Post vital signs: Reviewed and stable  Complications: No apparent anesthesia complications

## 2012-08-17 NOTE — Anesthesia Postprocedure Evaluation (Signed)
Anesthesia Post Note  Patient: Marcus Miles  Procedure(s) Performed: Procedure(s) (LRB): LAPAROSCOPIC ROUX-EN-Y GASTRIC (N/A) UPPER GI ENDOSCOPY ()  Anesthesia type: General  Patient location: PACU  Post pain: Pain level controlled  Post assessment: Post-op Vital signs reviewed  Last Vitals: BP 151/77  Pulse 55  Temp 36.4 C (Oral)  Resp 18  Ht 5' 9.5" (1.765 m)  Wt 252 lb (114.306 kg)  BMI 36.68 kg/m2  SpO2 97%  Post vital signs: Reviewed  Level of consciousness: sedated  Complications: No apparent anesthesia complications

## 2012-08-17 NOTE — Interval H&P Note (Signed)
History and Physical Interval Note:  08/17/2012 7:14 AM  Marcus Miles  has presented today for surgery, with the diagnosis of morbid obesity   The various methods of treatment have been discussed with the patient and family. After consideration of risks, benefits and other options for treatment, the patient has consented to  Procedure(s) (LRB) with comments: LAPAROSCOPIC ROUX-EN-Y GASTRIC (N/A) as a surgical intervention .  The patient's history has been reviewed, patient examined, no change in status, stable for surgery.  I have reviewed the patient's chart and labs.  Questions were answered to the patient's satisfaction.    Mary Sella. Andrey Campanile, MD, FACS General, Bariatric, & Minimally Invasive Surgery [facility] Surgery, [place]  [facility] M

## 2012-08-17 NOTE — Op Note (Signed)
Name:  [name] MRN: [id number] Date of Surgery: 08/17/2012  Preop Diagnosis:  Morbid Obesity, S/P RYGB  Postop Diagnosis:  Morbid Obesity, S/P RYGB  Procedure:  Upper endoscopy  (Intraoperative)  Surgeon:  Ovidio Kin, M.D.  Anesthesia:  GET  Indications for procedure: Marcus Miles is a 46 y.o. male whose primary care physician is Johny Blamer, MD and has completed a Roux-en-Y gastric bypass today by Dr. Fredonia Highland.  I am doing an intraoperative upper endoscopy to evaluate the gastric pouch and the gastro-jejunal anastomosis.  Operative Note: The patient is under general anesthesia.  Dr. Andrey Campanile is laparoscoping the patient while I do an upper endoscopy to evaluate the stomach pouch and gastrojejunal anastomosis.  With the patient intubated, I passed the Olympus endoscope without difficulty down the esophagus.  The esophago-gastric junction was at 41 cm.  The gastro-jejunal anastomosis was at 45-46 cm.  The mucosa of the stomach looked viable and the staple line was intact without bleeding.  The gastro-jejunal anastomosis looked okay.  While I insufflated the stomach pouch with air, Dr. Andrey Campanile clamped off the efferent limb of the jejunum.  He then flooded the upper abdomen with saline to put the gastric pouch and gastro-jejunal anastomosis under saline.  There was one episode of bubbling from the left corner of the pouch.  But repeat insufflation did not show any bubbling.  Photos were taken of the anastomosis.  Dr. Andrey Campanile put one stitch at the left corner of the pouch.  The scope was then withdrawn.  The esophagus was unremarkable and the patient tolerated the endoscopy without difficulty.  Ovidio Kin, MD, [facility] Pager: [phone] Office phone:  [phone]

## 2012-08-18 ENCOUNTER — Inpatient Hospital Stay (HOSPITAL_COMMUNITY): Payer: BC Managed Care – PPO

## 2012-08-18 ENCOUNTER — Encounter (HOSPITAL_COMMUNITY): Payer: Self-pay | Admitting: General Surgery

## 2012-08-18 LAB — CBC WITH DIFFERENTIAL/PLATELET
Basophils Absolute: 0 10*3/uL (ref 0.0–0.1)
Basophils Relative: 0 % (ref 0–1)
Eosinophils Absolute: 0.1 10*3/uL (ref 0.0–0.7)
Hemoglobin: 13.4 g/dL (ref 13.0–17.0)
Lymphocytes Relative: 11 % — ABNORMAL LOW (ref 12–46)
MCHC: 33.3 g/dL (ref 30.0–36.0)
Monocytes Relative: 12 % (ref 3–12)
Neutrophils Relative %: 76 % (ref 43–77)
WBC: 9.4 10*3/uL (ref 4.0–10.5)

## 2012-08-18 LAB — HEMOGLOBIN AND HEMATOCRIT, BLOOD
HCT: 43.2 % (ref 39.0–52.0)
Hemoglobin: 14 g/dL (ref 13.0–17.0)

## 2012-08-18 MED ORDER — IOHEXOL 300 MG/ML  SOLN
50.0000 mL | Freq: Once | INTRAMUSCULAR | Status: AC | PRN
  Administered 2012-08-18: 50 mL via ORAL

## 2012-08-18 NOTE — Progress Notes (Signed)
1 Day Post-Op  Subjective: Some gagging and nausea. Walked several times. Didn't sleep well - couldn't get comfortable  Objective: Vital signs in last 24 hours: Temp:  [96.8 F (36 C)-99.1 F (37.3 C)] 97.9 F (36.6 C) (12/17 0606) Pulse Rate:  [55-80] 78  (12/17 0606) Resp:  [15-20] 18  (12/17 0606) BP: (125-158)/(52-105) 149/92 mmHg (12/17 0606) SpO2:  [91 %-100 %] 93 % (12/17 0606) Weight:  [252 lb (114.306 kg)] 252 lb (114.306 kg) (12/16 1215) Last BM Date: 08/16/12  Intake/Output from previous day: 12/16 0701 - 12/17 0700 In: 5037.5 [I.V.:4737.5; IV Piggyback:300] Out: 2150 [Urine:2150] Intake/Output this shift:    Alert, sitting up. Smiling. nad cta Reg Obese, soft, dressing c/d/i; hypoBS. Mild expected TTP No edema  Lab Results:   Basename 08/18/12 0443 08/17/12 1327  WBC 9.4 10.3  HGB 13.4 13.7  HCT 40.2 40.8  PLT 95* 132*   BMET  Basename 08/17/12 1327  NA --  K --  CL --  CO2 --  GLUCOSE --  BUN --  CREATININE 1.04  CALCIUM --   PT/INR No results found for this basename: LABPROT:2,INR:2 in the last 72 hours ABG No results found for this basename: PHART:2,PCO2:2,PO2:2,HCO3:2 in the last 72 hours  Studies/Results: No results found.  Anti-infectives: Anti-infectives     Start     Dose/Rate Route Frequency Ordered Stop   08/17/12 0602   cefOXitin (MEFOXIN) 2 g in dextrose 5 % 50 mL IVPB        2 g 100 mL/hr over 30 Minutes Intravenous 60 min pre-op 08/17/12 0602 08/17/12 0715          Assessment/Plan: s/p Procedure(s) (LRB) with comments: LAPAROSCOPIC ROUX-EN-Y GASTRIC (N/A) UPPER GI ENDOSCOPY ()  hgb stable; cont VTE prophylaxis - scd and lovenox For UGI this am - if ok will start ice/water IS, ambulate BP ok  Mary Sella. Andrey Campanile, MD, FACS General, Bariatric, & Minimally Invasive Surgery [facility] Surgery, [place]   LOS: 1 day    Atilano Ina 08/18/2012

## 2012-08-18 NOTE — Progress Notes (Signed)
Dr. Andrey Campanile notified of UGI results; orders to begin POD #1 diet; Saprina, RN and pt aware of results and orders. Joen Laura, RN Bariatric Nurse Coordinator

## 2012-08-18 NOTE — Progress Notes (Signed)
Pt alert and oriented; sitting up on side of bed; wife at bedside; c/o nausea and dry heaving; encouraged to take prn meds; burping; denies flatus or BM at this time; voiding without difficulty; c/o abdominal pain with some relief from prn meds; ambulating in hallways without difficulty; using incentive as directed; NPO for UGI this am; pt already has follow up appts with [facility] and [facility]; aware of support group and BELT program; discharge instructions given for pt to review; questions answered.  GASTRIC BYPASS/SLEEVE DISCHARGE INSTRUCTIONS  Drs. Fredrik Rigger, [place]oxworth, Wilson, and [place] Call if you have any problems.   Call [phone] and ask for the surgeon on call.    If you need immediate assistance come to the ER at [facility]. Tell the ER personnel that you are a new post-op gastric bypass patient. Signs and symptoms to report:   Severe vomiting or nausea. If you cannot tolerate clear liquids for longer than 1 day, you need to call your surgeon.    Abdominal pain which does not get better after taking your pain medication   Fever greater than 101 F degree   Difficulty breathing   Chest pain    Redness, swelling, drainage, or foul odor at incision sites    If your incisions open or pull apart   Swelling or pain in calf (lower leg)   Diarrhea, frequent watery, uncontrolled bowel movements.   Constipation, (no bowel movements for 3 days) if this occurs, Take Milk of Magnesia, 2 tablespoons by mouth, 3 times a day for 2 days if needed.  Call your doctor if constipation continues. Stop taking Milk of Magnesia once you have had a bowel movement. You may also use Miralax according to the label instructions.   Anything you consider "abnormal for you".   Normal side effects after Surgery:   Unable to sleep at night or concentrate   Irritability   Being tearful (crying) or depressed   These are common complaints, possibly related to your anesthesia, stress of surgery and change in lifestyle,  that usually go away a few weeks after surgery.  If these feelings continue, call your medical doctor.  Wound Care You may have surgical glue, steri-strips, or staples over your incisions after surgery.  Surgical glue:  Looks like a clear film over your incisions and will wear off gradually. Steri-strips: Strips of tape over your incisions. You may notice a yellowish color on the skin underneath the steri-strips. This is a substance used to make the steri-strips stick better. Do not pull the steri-strips off - let them fall off.  Staples: Cherlynn Polo may be removed before you leave the hospital. If you go home with staples, call [facility] [phone]) for an appointment with your surgeon's nurse to have staples removed in 7 - 10 days. Showering: You may shower two days after your surgery unless otherwise instructed by your surgeon. Wash gently around wounds with warm soapy water, rinse well, and gently pat dry.  If you have a drain, you may need someone to hold this while you shower. Avoid tub baths until staples are removed and incisions are healed.    Medications   Medications should be liquid or crushed if larger than the size of a dime.  Extended release pills should not be crushed.   Depending on the size and number of medications you take, you may need to stagger/change the time you take your medications so that you do not over-fill your pouch.    Make sure  you follow-up with your primary care physician to make medication adjustments needed during rapid weight loss and life-style adjustment.   If you are diabetic, follow up with the doctor that prescribes your diabetes medication(s) within one week after surgery and check your blood sugar regularly.   Do not drive while taking narcotics!   Do not take acetaminophen (Tylenol) and Roxicet or Lortab Elixir at the same time since these pain medications contain acetaminophen.  Diet at home: (First 2 Weeks) You will see the  nutritionist two weeks after your surgery. She will advance your diet if you are tolerating liquids well. Once at home, if you have severe vomiting or nausea and cannot tolerate clear liquids lasting longer than 1 day, call your surgeon.  Begin high protein shake 2 ounces every 3 hours, 5 - 6 times per day.  Gradually increase the amount you drink as tolerated.  You may find it easier to slowly sip shakes throughout the day.  It is important to get your proteins in first.   Protein Shake   Drink at least 2 ounces of shake 5-6 times per day   Each serving of protein shakes should have a minimum of 15 grams of protein and no more than 5 grams of carbohydrate    Increase the amount of protein shake you drink as tolerated   Protein powder may be added to fluids such as non-fat milk or Lactaid milk (limit to 20 grams added protein powder per serving   The initial goal is to drink at least 8 ounces of protein shake/drink per day (or as directed by the nutritionist). Some examples of protein shakes are [employer], [employer]illard[employer], EAS Edge HP, and Unjury. Hydration   Gradually increase the amount of water and other liquids as tolerated (See Acceptable Fluids)   Gradually increase the amount of protein shake as tolerated     Sip fluids slowly and throughout the day   May use Sugar substitutes, use sparingly (limit to 6 - 8 packets per day). Your fluid goal is 64 ounces of fluid daily. It may take a few weeks to build up to this.         32 oz (or more) should be clear liquids and 32 oz (or more) should be full liquids.         Liquids should not contain sugar, caffeine, or carbonation! Acceptable Fluids Clear Liquids:   Water or Sugar-free flavored water, Fruit H2O   Decaffeinated coffee or tea (sugar-free)   Crystal Lite, Wyler's Lite, Minute Maid Lite   Sugar-free Jell-O   Bouillon or broth   Sugar-free Popsicle:   *Less than 20 calories each; Limit 1 per day   Full Liquids:               Protein Shakes/Drinks + 2 choices per day of other full liquids shown below.    Other full liquids must be: No more than 12 grams of Carbs per serving,  No more than 3 grams of Fat per serving   Strained low-fat cream soup   Non-Fat milk   Fat-free Lactaid Milk   Sugar-free yogurt (Dannon Lite & Fit) Vitamins and Minerals (Start 1 day after surgery unless otherwise directed)   2 Chewable Multivitamin / Multimineral Supplement (i.e. Centrum for Adults)   Chewable Calcium Citrate with Vitamin D-3. Take 1500 mg each day.           (Example: 3 Chewable Calcium Plus 600 with Vitamin D-3 can be  found at [facility])         Vitamin B-12, 350 - 500 micrograms (oral tablet) each day   Do not mix multivitamins containing iron with calcium supplements; take 2 hours   apart   Do not substitute Tums (calcium carbonate) for your calcium   Menstruating women and those at risk for anemia may need extra iron. Talk with your doctor to see if you need additional iron.    If you need extra iron:  Total daily Iron recommendations (including Vitamins) = 50 - 100 mg Iron/day Do not stop taking or change any vitamins or minerals until you talk to your nutritionist or surgeon. Your nutritionist and / or physician must approve all vitamin and mineral supplements. Exercise For maximum success, begin exercising as soon as your doctor recommends. Make sure your physician approves any physical activity.   Depending on fitness level, begin with a simple walking program   Walk 5-15 minutes each day, 7 days per week.    Slowly increase until you are walking 30-45 minutes per day   Consider joining our BELT program. [phone] or email belt@[employer] .edu Things to remember:    You may have sexual relations when you feel comfortable. It is VERY important for male patients to use a reliable birth control method. Fertility often increases after surgery. Do not get pregnant for at least 18 months.   It is very important to keep all  follow up appointments with your surgeon, nutritionist, primary care physician, and behavioral health practitioner. After the first year, please follow up with your bariatric surgeon at least once a year in order to maintain best weight loss results.  [facility]: [phone] [facility]: [phone]   Free counseling is available for you and your family through collaboration between [facility] and [place]. Please call [phone] and leave a message.    Consider purchasing a medical alert bracelet that says you had gastric bypass surgery.    [facility] has a free Bariatric Surgery Support Group that meets monthly, the 3rd Thursday, 6 pm, Classroom #1, [place]. You may register online at [email or web address], but registration is not necessary. Select Classes and Support Groups, Bariatric Surgery, or Call [phone]   Do not return to work or drive until cleared by your surgeon   Use your CPAP when sleeping if applicable   Do not lift anything greater than ten pounds for at least two weeks  Joen Laura, RN Bariatric Nurse Coordinator

## 2012-08-18 NOTE — Care Management Note (Signed)
    Page 1 of 1   08/18/2012     11:34:35 AM   CARE MANAGEMENT NOTE 08/18/2012  Patient:  Marcus Miles, Marcus Miles   Account Number:  [id number]  Date Initiated:  08/18/2012  Documentation initiated by:  Lorenda Ishihara  Subjective/Objective Assessment:   46 yo male admitted s/p gastric bypass. PTA lived at home with spouse.     Action/Plan:   Home when stable   Anticipated DC Date:  08/19/2012   Anticipated DC Plan:  HOME/SELF CARE      DC Planning Services  CM consult      Choice offered to / List presented to:             Status of service:  Completed, signed off Medicare Important Message given?   (If response is "NO", the following Medicare IM given date fields will be blank) Date Medicare IM given:   Date Additional Medicare IM given:    Discharge Disposition:  HOME/SELF CARE  Per UR Regulation:  Reviewed for med. necessity/level of care/duration of stay  If discussed at Long Length of Stay Meetings, dates discussed:    Comments:

## 2012-08-19 ENCOUNTER — Encounter (INDEPENDENT_AMBULATORY_CARE_PROVIDER_SITE_OTHER): Payer: BC Managed Care – PPO | Admitting: General Surgery

## 2012-08-19 DIAGNOSIS — Z9884 Bariatric surgery status: Secondary | ICD-10-CM

## 2012-08-19 DIAGNOSIS — I10 Essential (primary) hypertension: Secondary | ICD-10-CM | POA: Diagnosis present

## 2012-08-19 LAB — CBC WITH DIFFERENTIAL/PLATELET
Basophils Absolute: 0 10*3/uL (ref 0.0–0.1)
Basophils Relative: 0 % (ref 0–1)
Hemoglobin: 12.8 g/dL — ABNORMAL LOW (ref 13.0–17.0)
MCHC: 32.5 g/dL (ref 30.0–36.0)
Neutro Abs: 7.5 10*3/uL (ref 1.7–7.7)
Neutrophils Relative %: 75 % (ref 43–77)
RDW: 13.2 % (ref 11.5–15.5)
WBC: 9.9 10*3/uL (ref 4.0–10.5)

## 2012-08-19 MED ORDER — OXYCODONE-ACETAMINOPHEN 5-325 MG/5ML PO SOLN: 5.0000 mL | ORAL | Status: DC | PRN

## 2012-08-19 MED ORDER — METOPROLOL TARTRATE 50 MG PO TABS: 50.0000 mg | ORAL_TABLET | Freq: Two times a day (BID) | ORAL | Status: DC

## 2012-08-19 MED ORDER — PANTOPRAZOLE SODIUM 40 MG PO TBEC: 40.0000 mg | DELAYED_RELEASE_TABLET | Freq: Every day | ORAL | Status: DC

## 2012-08-19 NOTE — Progress Notes (Signed)
Dr. Andrey Campanile notified regarding patient decrease of temperature to 99.4 orally.  Patient blood pressure and pulse within normal limits.Patient tolerated unjury shake without difficulty and is walking the halls and using his incentive spirometer appropriately.  Patient clear for discharge per Dr. Andrey Campanile with instruction to call for temperature of 101 oral or greater.  Francene Finders, RN

## 2012-08-19 NOTE — Progress Notes (Signed)
Dr. Andrey Campanile notified regarding patient temperature being 100.5 orally.  New orders received and initiated.  Francene Finders, RN

## 2012-08-19 NOTE — Discharge Summary (Signed)
Physician Discharge Summary  Marcus Miles VHQ:[id number] DOB: 1965/10/20 DOA: 08/17/2012  PCP: Johny Blamer, MD  Admit date: 08/17/2012 Discharge date: 08/19/2012  Recommendations for Outpatient Follow-up:  1. Make an appt to follow-up with your primary care doctor within next 4 weeks   Follow-up Information    Follow up with Atilano Ina, MD,FACS. Schedule an appointment as soon as possible for a visit in 2 weeks.   Contact information:   [address] [place] [address] [phone]         Discharge Diagnoses:  Patient Active Problem List  . Obesity (BMI 30-39.9)  . Hypertension  . s/p Laparoscopic roux-en-y gastric bypass    Surgical Procedure: Laparoscopic roux-en-y gastric bypass (ante-colic, antegastric), upper endoscopy  Discharge Condition: good Disposition: to home  Diet recommendation:  Post op gastric bypass diet  Filed Weights   08/17/12 0535 08/17/12 1215  Weight: 252 lb 2 oz (114.363 kg) 252 lb (114.306 kg)    Hospital Course:  46 year old obese white male with hypertension underwent a planned laparoscopic Roux-en-Y gastric bypass. His postoperative course was unremarkable. He was maintained on perioperative DVT prophylaxis. On postoperative day 1 he underwent an upper GI which demonstrated no evidence of extravasation of contrast. He was started on ice chips and sips of water which he tolerated. On postoperative day 2 he was advanced to protein shakes which he tolerated. He denied any reflux, vomiting, or upper abdominal pain. He did have some incisional soreness. He was ambulating without difficulty. His vital signs were stable. His hemoglobin had remained stable throughout his hospitalization. His lungs were clear to auscultation. He was not tachycardic. His abdomen was soft and nontender. His incisions were clean dry and intact. He was deemed stable for discharge.  BP 127/73  Pulse 95  Temp 99.4 F (37.4 C) (Oral)  Resp 16  Ht  5' 9.5" (1.765 m)  Wt 252 lb (114.306 kg)  BMI 36.68 kg/m2  SpO2 95%    Discharge Instructions See Bariatric discharge instructions Discharge Orders    Future Appointments: Provider: Department: Dept Phone: Center:   08/31/2012 4:00 PM [facility] Post-Op Class [facility] [phone] NDM       Medication List     As of 08/19/2012  8:30 AM    STOP taking these medications         aspirin EC 81 MG tablet      metoprolol 200 MG 24 hr tablet   Commonly known as: TOPROL-XL      TAKE these medications         acetaminophen 500 MG tablet   Commonly known as: TYLENOL   Take 1,000 mg by mouth every 6 (six) hours as needed. Pain      lisinopril-hydrochlorothiazide 20-12.5 MG per tablet   Commonly known as: PRINZIDE,ZESTORETIC   Take 2 tablets by mouth every morning.      metoprolol 50 MG tablet   Commonly known as: LOPRESSOR   Take 1 tablet (50 mg total) by mouth 2 (two) times daily.      oxyCODONE-acetaminophen 5-325 MG/5ML solution   Commonly known as: ROXICET   Take 5-10 mLs by mouth every 4 (four) hours as needed.      peg 3350 powder 100 G Solr   Commonly known as: MOVIPREP   Take 1 kit (100 g total) by mouth once.           Follow-up Information    Follow up with  Atilano Ina, MD,FACS. Schedule an appointment as soon as possible for a visit in 2 weeks.   Contact information:   [address] [place] [address] [phone]           The results of significant diagnostics from this hospitalization (including imaging, microbiology, ancillary and laboratory) are listed below for reference.    Significant Diagnostic Studies: Dg Ugi W/water Sol Cm  08/18/2012  *RADIOLOGY REPORT*  Clinical Data:  Status post gastric bypass surgery  UPPER GI SERIES WITH KUB  Technique:  Routine upper GI series was performed with water soluble contrast  Fluoroscopy Time: 0.36  minutes  Comparison:  10/10/2011  Findings:  Scout radiograph shows multiple air-filled loops of small bowel in the right abdomen which may represent postop ileus.  The patient ingested approximately 50 ml of water soluble contrast material.  Rapid passage of contrast material through the esophagus and into the gastric pouch was visualized.  No extravasation of contrast material identified.  Delayed images showed distal passage of contrast into the jejunum.  IMPRESSION:  1.  Expected postoperative appearance of the distal esophagus and stomach status post Roux-en-Y gastric bypass surgery. 2.  No evidence of contrast extravasation to suggest leakage   Original Report Authenticated By: Signa Kell, M.D.     Microbiology: Recent Results (from the past 240 hour(s))  SURGICAL PCR SCREEN     Status: Normal   Collection Time   08/13/12  1:52 PM      Component Value Range Status Comment   MRSA, PCR NEGATIVE  NEGATIVE Final    Staphylococcus aureus NEGATIVE  NEGATIVE Final      Labs: Basic Metabolic Panel:  Lab 08/17/12 7846 08/13/12 1430  NA -- 140  K -- 3.9  CL -- 100  CO2 -- 30  GLUCOSE -- 92  BUN -- 17  CREATININE 1.04 0.97  CALCIUM -- 9.5  MG -- --  PHOS -- --   Liver Function Tests:  Lab 08/13/12 1430  AST 26  ALT 30  ALKPHOS 58  BILITOT 0.4  PROT 7.2  ALBUMIN 4.1   CBC:  Lab 08/19/12 0445 08/18/12 1631 08/18/12 0443 08/17/12 1327 08/13/12 1430  WBC 9.9 -- 9.4 10.3 7.7  NEUTROABS 7.5 -- 7.2 -- 4.5  HGB 12.8* 14.0 13.4 13.7 14.1  HCT 39.4 43.2 40.2 40.8 42.6  MCV 88.7 -- 86.8 86.3 87.3  PLT 75* -- 95* 132* 184    Principal Problem:  *Obesity (BMI 30-39.9) Active Problems:  Hypertension  s/p Laparoscopic roux-en-y gastric bypass   Time coordinating discharge:  Signed:  Atilano Ina, MD [facility], [place] [phone] 08/19/2012, 8:30 AM

## 2012-08-19 NOTE — Progress Notes (Signed)
Pt alert and oriented; wife at bedside; pt states nausea much better; denies any vomiting; tolerating water well; will advance to protein shakes; some burping; +flatus; denies BM at this time; voiding without difficulty; ambulating in hallways without difficulty; c/o some abdominal soreness with relief from prn meds;using incentive as directed; pt already has follow up appts with [facility] and [facility]; aware of support group and BELT program; discharge instructions reviewed and pt and wife verbalized understanding of; questions answered.  GASTRIC BYPASS/SLEEVE DISCHARGE INSTRUCTIONS  Drs. Fredrik Rigger, Hoxworth, Wilson, and [place] Call if you have any problems.   Call [phone] and ask for the surgeon on call.    If you need immediate assistance come to the ER at [facility]. Tell the ER personnel that you are a new post-op gastric bypass patient. Signs and symptoms to report:   Severe vomiting or nausea. If you cannot tolerate clear liquids for longer than 1 day, you need to call your surgeon.    Abdominal pain which does not get better after taking your pain medication   Fever greater than 101 F degree   Difficulty breathing   Chest pain    Redness, swelling, drainage, or foul odor at incision sites    If your incisions open or pull apart   Swelling or pain in calf (lower leg)   Diarrhea, frequent watery, uncontrolled bowel movements.   Constipation, (no bowel movements for 3 days) if this occurs, Take Milk of Magnesia, 2 tablespoons by mouth, 3 times a day for 2 days if needed.  Call your doctor if constipation continues. Stop taking Milk of Magnesia once you have had a bowel movement. You may also use Miralax according to the label instructions.   Anything you consider "abnormal for you".   Normal side effects after Surgery:   Unable to sleep at night or concentrate   Irritability   Being tearful (crying) or depressed   These are common complaints, possibly related to your anesthesia, stress  of surgery and change in lifestyle, that usually go away a few weeks after surgery.  If these feelings continue, call your medical doctor.  Wound Care You may have surgical glue, steri-strips, or staples over your incisions after surgery.  Surgical glue:  Looks like a clear film over your incisions and will wear off gradually. Steri-strips: Strips of tape over your incisions. You may notice a yellowish color on the skin underneath the steri-strips. This is a substance used to make the steri-strips stick better. Do not pull the steri-strips off - let them fall off.  Staples: Cherlynn Polo may be removed before you leave the hospital. If you go home with staples, call [facility] [phone]) for an appointment with your surgeon's nurse to have staples removed in 7 - 10 days. Showering: You may shower two days after your surgery unless otherwise instructed by your surgeon. Wash gently around wounds with warm soapy water, rinse well, and gently pat dry.  If you have a drain, you may need someone to hold this while you shower. Avoid tub baths until staples are removed and incisions are healed.    Medications   Medications should be liquid or crushed if larger than the size of a dime.  Extended release pills should not be crushed.   Depending on the size and number of medications you take, you may need to stagger/change the time you take your medications so that you do not over-fill your pouch.    Make sure you follow-up with  your primary care physician to make medication adjustments needed during rapid weight loss and life-style adjustment.   If you are diabetic, follow up with the doctor that prescribes your diabetes medication(s) within one week after surgery and check your blood sugar regularly.   Do not drive while taking narcotics!   Do not take acetaminophen (Tylenol) and Roxicet or Lortab Elixir at the same time since these pain medications contain acetaminophen.  Diet at home: (First 2  Weeks) You will see the nutritionist two weeks after your surgery. She will advance your diet if you are tolerating liquids well. Once at home, if you have severe vomiting or nausea and cannot tolerate clear liquids lasting longer than 1 day, call your surgeon.  Begin high protein shake 2 ounces every 3 hours, 5 - 6 times per day.  Gradually increase the amount you drink as tolerated.  You may find it easier to slowly sip shakes throughout the day.  It is important to get your proteins in first.   Protein Shake   Drink at least 2 ounces of shake 5-6 times per day   Each serving of protein shakes should have a minimum of 15 grams of protein and no more than 5 grams of carbohydrate    Increase the amount of protein shake you drink as tolerated   Protein powder may be added to fluids such as non-fat milk or Lactaid milk (limit to 20 grams added protein powder per serving   The initial goal is to drink at least 8 ounces of protein shake/drink per day (or as directed by the nutritionist). Some examples of protein shakes are [employer], [employer], EAS Edge HP, and Unjury. Hydration   Gradually increase the amount of water and other liquids as tolerated (See Acceptable Fluids)   Gradually increase the amount of protein shake as tolerated     Sip fluids slowly and throughout the day   May use Sugar substitutes, use sparingly (limit to 6 - 8 packets per day). Your fluid goal is 64 ounces of fluid daily. It may take a few weeks to build up to this.         32 oz (or more) should be clear liquids and 32 oz (or more) should be full liquids.         Liquids should not contain sugar, caffeine, or carbonation! Acceptable Fluids Clear Liquids:   Water or Sugar-free flavored water, Fruit H2O   Decaffeinated coffee or tea (sugar-free)   Crystal Lite, Wyler's Lite, Minute Maid Lite   Sugar-free Jell-O   Bouillon or broth   Sugar-free Popsicle:   *Less than 20 calories each; Limit 1 per day   Full  Liquids:              Protein Shakes/Drinks + 2 choices per day of other full liquids shown below.    Other full liquids must be: No more than 12 grams of Carbs per serving,  No more than 3 grams of Fat per serving   Strained low-fat cream soup   Non-Fat milk   Fat-free Lactaid Milk   Sugar-free yogurt (Dannon Lite & Fit) Vitamins and Minerals (Start 1 day after surgery unless otherwise directed)   2 Chewable Multivitamin / Multimineral Supplement (i.e. Centrum for Adults)   Chewable Calcium Citrate with Vitamin D-3. Take 1500 mg each day.           (Example: 3 Chewable Calcium Plus 600 with Vitamin D-3 can be found at [facility])  Vitamin B-12, 350 - 500 micrograms (oral tablet) each day   Do not mix multivitamins containing iron with calcium supplements; take 2 hours   apart   Do not substitute Tums (calcium carbonate) for your calcium   Menstruating women and those at risk for anemia may need extra iron. Talk with your doctor to see if you need additional iron.    If you need extra iron:  Total daily Iron recommendations (including Vitamins) = 50 - 100 mg Iron/day Do not stop taking or change any vitamins or minerals until you talk to your nutritionist or surgeon. Your nutritionist and / or physician must approve all vitamin and mineral supplements. Exercise For maximum success, begin exercising as soon as your doctor recommends. Make sure your physician approves any physical activity.   Depending on fitness level, begin with a simple walking program   Walk 5-15 minutes each day, 7 days per week.    Slowly increase until you are walking 30-45 minutes per day   Consider joining our BELT program. [phone] or email belt@[employer] .edu Things to remember:    You may have sexual relations when you feel comfortable. It is VERY important for male patients to use a reliable birth control method. Fertility often increases after surgery. Do not get pregnant for at least 18 months.   It is very  important to keep all follow up appointments with your surgeon, nutritionist, primary care physician, and behavioral health practitioner. After the first year, please follow up with your bariatric surgeon at least once a year in order to maintain best weight loss results.  [facility]: [phone] [facility]: [phone]   Free counseling is available for you and your family through collaboration between [facility] and [facility]. Please call [phone] and leave a message.    Consider purchasing a medical alert bracelet that says you had gastric bypass surgery.    The [facility] has a free Bariatric Surgery Support Group that meets monthly, the 3rd Thursday, 6 pm, Classroom #1, [place]. You may register online at [email or web address], but registration is not necessary. Select Classes and Support Groups, Bariatric Surgery, or Call [phone]   Do not return to work or drive until cleared by your surgeon   Use your CPAP when sleeping if applicable   Do not lift anything greater than ten pounds for at least two weeks  Joen Laura, RN Bariatric Nurse coordinator

## 2012-08-20 ENCOUNTER — Telehealth (INDEPENDENT_AMBULATORY_CARE_PROVIDER_SITE_OTHER): Payer: Self-pay | Admitting: General Surgery

## 2012-08-20 NOTE — Telephone Encounter (Signed)
Called patient to check on him after surgery. His temperature has gotten to around 100 but no higher and he is feeling fine. His incisions look good. Dr Andrey Campanile made aware. Patient will call with any increase in temp or development of other symptoms and keep his appt on 09/05/2011.

## 2012-08-31 ENCOUNTER — Encounter: Payer: BC Managed Care – PPO | Admitting: *Deleted

## 2012-08-31 VITALS — Ht 69.5 in | Wt 233.5 lb

## 2012-08-31 DIAGNOSIS — E669 Obesity, unspecified: Secondary | ICD-10-CM

## 2012-09-01 ENCOUNTER — Encounter: Payer: Self-pay | Admitting: *Deleted

## 2012-09-04 ENCOUNTER — Encounter (INDEPENDENT_AMBULATORY_CARE_PROVIDER_SITE_OTHER): Payer: Self-pay | Admitting: General Surgery

## 2012-09-04 ENCOUNTER — Ambulatory Visit (INDEPENDENT_AMBULATORY_CARE_PROVIDER_SITE_OTHER): Payer: BC Managed Care – PPO | Admitting: General Surgery

## 2012-09-04 VITALS — BP 142/80 | HR 74 | Temp 98.6°F | Resp 16 | Ht 69.0 in | Wt 227.8 lb

## 2012-09-04 DIAGNOSIS — Z09 Encounter for follow-up examination after completed treatment for conditions other than malignant neoplasm: Secondary | ICD-10-CM

## 2012-09-04 NOTE — Progress Notes (Addendum)
Bariatric Class:  Appt start time: 1600 end time:  1700.  2 Week Post-Operative Nutrition Class  Patient was seen on 08/31/12 for Post-Operative Nutrition [facility].   Surgery date: 08/17/12  Surgery type: RYGB  Start weight at [facility]: 258.4 lbs (11/06/11)   Last weight: 257.7 lbs  Weight today: 233.5 lbs  Weight change:  Total weight loss:   BMI: 38.1 Goal weight: 190 lbs  The following the learning objectives were met by the patient during this course:   Identifies Phase 3A (Soft, High Proteins) Dietary Goals and will begin from 2 weeks post-operatively to 2 months post-operatively  Identifies appropriate sources of fluids and proteins   States protein recommendations and appropriate sources post-operatively  Identifies the need for appropriate texture modifications, mastication, and bite sizes when consuming solids  Identifies appropriate multivitamin and calcium sources post-operatively  Describes the need for physical activity post-operatively and will follow MD recommendations  States when to call healthcare provider regarding medication questions or post-operative complications  Handouts given during class include:  Phase 3A: Soft, High Protein Diet Handout  Follow-Up Plan: Patient will follow-up at [facility] in 6 weeks for 2 months post-op nutrition visit for diet advancement per MD.

## 2012-09-04 NOTE — Patient Instructions (Signed)
Remember to chew your food very thoroughly  Wait about 30 sec between bites of food Make an appointment to see your primary care doctor within next 6 weeks

## 2012-09-04 NOTE — Progress Notes (Signed)
Subjective:     Patient ID: Marcus Miles, male   DOB: 07/25/1966, 47 y.o.   MRN: [id number]  HPI 47 year old Caucasian male status post laparoscopic Roux-en-Y gastric bypass with upper endoscopy on December 16 comes in for his postoperative appointment. He was discharged from the hospital on December 18. He has already neen advanced to the next phase of his postoperative diet. He saw the nutritionist on December 30. He has had 2 episodes of regurgitation. He believes he ate too quickly. He denies any fever, chills, nausea, diarrhea or constipation. He denies any melena or hematochezia. He denies any abdominal pain. He states that he is taking his supplements. He is eager to return back to work. He denies any heartburn or indigestion.  Review of Systems     Objective:   Physical Exam BP 142/80  Pulse 74  Temp 98.6 F (37 C) (Temporal)  Resp 16  Ht 5\' 9"  (1.753 m)  Wt 227 lb 12.8 oz (103.329 kg)  BMI 33.64 kg/m2  Gen: alert, NAD, non-toxic appearing Pupils: equal, no scleral icterus Pulm: Lungs clear to auscultation, symmetric chest rise CV: regular rate and rhythm Abd: soft, nontender, nondistended. Well-healed trocar sites. No cellulitis. No incisional hernia Ext: no edema, no calf tenderness Skin: no rash, no jaundice     Assessment:     S/p Laparoscopic roux en y gastric bypass HTN - stable OA - stable    Plan:     His weight loss since surgery has been roughly 27 pounds. His total weight loss since his first visit in January 2013 has been 37 pounds. I reminded him the importance of chewing very thoroughly and waiting 30-40 seconds between taking bites of food. I encouraged him to continue with his daily supplements. We will not renew his protonix prescription. He was encouraged to followup with his primary care physician within the next 6 weeks regarding his blood pressure. We did switch him from Toprol-XL to Toprol twice a day because of his new anatomy. I congratulated  him on his weight loss. We discussed the importance of getting regular exercise. He was given a note to return to work.  Mary Sella. Andrey Campanile, MD, FACS General, Bariatric, & Minimally Invasive Surgery [facility], [place]

## 2012-10-09 ENCOUNTER — Encounter: Payer: BC Managed Care – PPO | Attending: General Surgery | Admitting: *Deleted

## 2012-10-09 ENCOUNTER — Encounter: Payer: Self-pay | Admitting: *Deleted

## 2012-10-09 VITALS — Ht 70.0 in | Wt 211.0 lb

## 2012-10-09 DIAGNOSIS — Z01818 Encounter for other preprocedural examination: Secondary | ICD-10-CM | POA: Insufficient documentation

## 2012-10-09 DIAGNOSIS — E669 Obesity, unspecified: Secondary | ICD-10-CM | POA: Insufficient documentation

## 2012-10-09 DIAGNOSIS — Z713 Dietary counseling and surveillance: Secondary | ICD-10-CM | POA: Insufficient documentation

## 2012-10-09 NOTE — Progress Notes (Signed)
Follow-up visit:  8 Weeks Post-Operative RYGB Surgery  Medical Nutrition Therapy:  Appt start time: 1045  End time:  1100.  Primary concerns today: Post-operative Bariatric Surgery Nutrition Management. Carmino arrived 15 min late for his appt, so only able to get overall impression. Doing well, though reports eating only granola (3/4 cup over the day) and a normal dinner during the week. States his job is very demanding and he is unable to take a lunch.  Reports he cannot tolerate chicken, but doing well with all other proteins/approved veggies. No longer drinking protein shakes.   Surgery date: 08/17/12  Surgery type: RYGB  [facility] weight at [facility]: 258.4 lbs (11/06/11)   Last weight: 233.5 lbs  Weight today: 211.0 lbs  Weight change: 22.5 lbs Total weight loss: 47.4 lbs  Goal weight: 190 lbs % goal met: 69%  TANITA  BODY COMP RESULTS  11/13/11 10/09/12   BMI (kg/m^2) 37.2 30.3   Fat Mass (lbs) 90.5 59.0   Fat Free Mass (lbs) 161.5 152.0   Total Body Water (lbs) 118.0 111.5   24-hr recall: B (AM): Granola (3/4 cup over work day) Snk (AM): Granola  L (PM): Granola (on weekends - [place], ham, and string cheese roll-up) Snk (PM): Granola  D (PM): Hamburger steak or pot roast, green beans OR small chili @ [employer] Snk (PM): None  Fluid intake:  40-60 oz during week; 80 oz on weekends Estimated total protein intake: 20-30 g during week; ~ 50 g on weekends  Medications: Only taking metoprolol at this time Supplementation: Taking as instructed   Using straws: Unknown Drinking while eating: Unknown Hair loss: Unknown Carbonated beverages: Unknown N/V/D/C: None, though cannot tolerate chicken Dumping syndrome: Unknown  Recent physical activity:  Unknown  Progress Towards Goal(s):  In progress.  Handouts given during visit include:  Phase 3B: High Protein + Non-Starchy Vegetables   Nutritional Diagnosis:  Blanchard-3.3 Overweight/obesity related to past poor dietary habits and physical  inactivity as evidenced by patient w/ recent RYGB surgery following dietary guidelines for continued weight loss.    Intervention:  Nutrition education/diet advancement.  Monitoring/Evaluation:  Dietary intake, exercise, and body weight. Follow up in 1 months for 3 month post-op visit.

## 2012-10-09 NOTE — Patient Instructions (Addendum)
Goals:  Follow Phase 3B: High Protein + Non-Starchy Vegetables  Eat 3-6 small meals/snacks, every 3-5 hrs  Increase lean protein foods to meet 80g goal  Increase fluid intake to 64oz +  Avoid drinking 15 minutes before, during and 30 minutes after eating  Aim for >30 min of physical activity daily 

## 2012-11-05 ENCOUNTER — Encounter: Payer: Self-pay | Admitting: *Deleted

## 2012-11-05 ENCOUNTER — Encounter: Payer: BC Managed Care – PPO | Attending: General Surgery | Admitting: *Deleted

## 2012-11-05 VITALS — Ht 70.0 in | Wt 201.2 lb

## 2012-11-05 DIAGNOSIS — E669 Obesity, unspecified: Secondary | ICD-10-CM | POA: Insufficient documentation

## 2012-11-05 DIAGNOSIS — Z713 Dietary counseling and surveillance: Secondary | ICD-10-CM | POA: Insufficient documentation

## 2012-11-05 DIAGNOSIS — Z01818 Encounter for other preprocedural examination: Secondary | ICD-10-CM | POA: Insufficient documentation

## 2012-11-05 NOTE — Progress Notes (Signed)
Follow-up visit:  12 Weeks Post-Operative RYGB Surgery  Medical Nutrition Therapy:  Appt start time:  415  End time:  445.  Primary concerns today: Post-operative Bariatric Surgery Nutrition Management. Marcus Miles returns today for 3 mo f/u with additional 9.8 lb wt loss putting him 10 lbs away from goal wt. Doing very well with no problems reported. Continues to work on increasing protein intake.  *NOTE: Tanita scan broken today.   Surgery date: 08/17/12  Surgery type: RYGB  Start weight at [facility]: 258.4 lbs (11/06/11)   Weight today: 201.2 lbs  Weight change: 9.8 lbs Total weight loss: 57.2 lbs  Goal weight: 190 lbs % goal met: 84%  TANITA  BODY COMP RESULTS  11/13/11 10/09/12 11/05/12   BMI (kg/m^2) 37.2 30.3 28.9   Fat Mass (lbs) 90.5 59.0 --   Fat Free Mass (lbs) 161.5 152.0 --   Total Body Water (lbs) 118.0 111.5 --   24-hr recall: (Works 7 am - 3:30 pm) B (5 AM): Beef tips (2 oz), cream of mushroom soup (15-18 g pro) Snk (9 AM): Granola - 1/4 cup - (9 g pro) L (12 PM): Granola - 1/4 cup - (9 g pro) Snk (PM): NONE  D (PM): 4 oz beef and broccoli @ [employer] without rice (10-15g pro) Snk (PM): None  Fluid intake:  45 oz SF juice/tea, 24 oz water = ~ 70 oz Estimated total protein intake: 45-50 g   Medications: No changes reported Supplementation: Taking as instructed    Using straws: No Drinking while eating: No Hair loss: No Carbonated beverages: No N/V/D/C:  Vomiting x1 from protein bar with 7g sugar alcohols Dumping syndrome: Unknown  Recent physical activity:  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   Nutritional Diagnosis:  Banks-3.3 Overweight/obesity related to past poor dietary habits and physical inactivity as evidenced by patient w/ recent RYGB surgery following dietary guidelines for continued weight loss.    Intervention:  Nutrition education/diet advancement. Added starches and fruit - 1  serving per meal (~15g CHO). Stressed importance of meeting protein goals first.  Handouts given during visit include:   Phase 4: Low-Fat, Low-Carb Solid Food  Monitoring/Evaluation:  Dietary intake, exercise, and body weight. Follow up in 3 months for 6 month post-op visit.

## 2012-11-05 NOTE — Patient Instructions (Addendum)
Goals:  Follow Phase 4: Low-Fat, Low-Carb Solid Food  Eat 3-6 small meals/snacks, every 3-5 hrs  Increase lean protein foods to meet 70-80g goal  Increase fluid intake to 64oz +  Add 15 grams of carbohydrate (fruit, whole grain, starchy vegetable) with meals  Aim for >30 min of physical activity daily

## 2012-11-11 ENCOUNTER — Encounter (INDEPENDENT_AMBULATORY_CARE_PROVIDER_SITE_OTHER): Payer: Self-pay | Admitting: General Surgery

## 2013-01-21 ENCOUNTER — Telehealth (INDEPENDENT_AMBULATORY_CARE_PROVIDER_SITE_OTHER): Payer: Self-pay | Admitting: General Surgery

## 2013-01-21 ENCOUNTER — Other Ambulatory Visit (INDEPENDENT_AMBULATORY_CARE_PROVIDER_SITE_OTHER): Payer: Self-pay | Admitting: General Surgery

## 2013-01-21 ENCOUNTER — Ambulatory Visit (INDEPENDENT_AMBULATORY_CARE_PROVIDER_SITE_OTHER): Payer: BC Managed Care – PPO | Admitting: General Surgery

## 2013-01-21 ENCOUNTER — Encounter (INDEPENDENT_AMBULATORY_CARE_PROVIDER_SITE_OTHER): Payer: Self-pay | Admitting: General Surgery

## 2013-01-21 VITALS — BP 142/88 | HR 66 | Temp 97.7°F | Resp 16 | Ht 69.5 in | Wt 192.2 lb

## 2013-01-21 DIAGNOSIS — Z9884 Bariatric surgery status: Secondary | ICD-10-CM

## 2013-01-21 NOTE — Patient Instructions (Signed)
Consider getting a pedometer and tracking your daily step count and making weekly goals to increase your daily step count

## 2013-01-21 NOTE — Telephone Encounter (Signed)
LMOM giving patient his 3 mo follow up appt for 04/28/13 @11 :15...told him to call us back if this did not work for him.Marland KitchenMarland KitchenI called him at 5:15 on 01/21/13

## 2013-01-21 NOTE — Progress Notes (Signed)
Subjective:     Patient ID: Marcus Miles, male   DOB: [birth year], 47 y.o.   MRN: [id number]  HPI 47 year old Caucasian male comes in for long-term followup after undergoing laparoscopic Roux-en-Y gastric bypass surgery in December 2013. I last saw him in the office in January 2014. Since that time he states that he has been doing well. He reports 2 cases of dumping since his last visit. He was able to determine it was due to sugar alcohol in a sugar-free pudding snack. He denies any abdominal pain, diarrhea or constipation. He does report a little bit of fatigue. He denies any numbness or appears seizures. He denies any heartburn or indigestion. He states that his primary care physician took him off his blood pressure medication. He states that he continues to have some difficulty tolerating chicken as well as CABG. He reports having one bowel movement a day. He is working 12-13 hours a day. He states that he does not get any additional exercise outside of work. He reports that he is compliant in taking his multivitamin and supplement  PMHx, PSHx, SOCHx, FAMHx, ALL reviewed   Review of Systems 10 point review of systems was performed and all systems are negative except for what is mentioned in the history of present illness    Objective:   Physical Exam BP 142/88  Pulse 66  Temp(Src) 97.7 F (36.5 C) (Temporal)  Resp 16  Ht 5' 9.5" (1.765 m)  Wt 192 lb 3.2 oz (87.181 kg)  BMI 27.99 kg/m2  Gen: alert, NAD, non-toxic appearing Pupils: equal, no scleral icterus Pulm: Lungs clear to auscultation, symmetric chest rise CV: regular rate and rhythm Abd: soft, nontender, nondistended. Well-healed trocar sites. No cellulitis. 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 His weight loss since his immediate preoperative visit has been 62 pounds. I congratulated him on his weight loss. I encouraged him to continue to be compliant with his multivitamin supplements. We'll need to monitor his blood pressure.  We discussed the importance of incorporating some regular activity into his daily life. I explained that if he does not start incorporating some routine exercise he may fall into the subset of gastric bypass patients that slowly regains weight. We discussed using a pedometer to track his daily step count and then setting weekly goals to increase his step count.  He is due for surveillance labs. He was given a prescription to have his lab work performed at his primary care physician's office. Followup 3 months  Mary Sella. Andrey Campanile, MD, FACS General, Bariatric, & Minimally Invasive Surgery [facility], [place]

## 2013-02-04 ENCOUNTER — Encounter: Payer: Self-pay | Admitting: *Deleted

## 2013-02-04 ENCOUNTER — Encounter: Payer: BC Managed Care – PPO | Attending: General Surgery | Admitting: *Deleted

## 2013-02-04 VITALS — Ht 70.0 in | Wt 189.0 lb

## 2013-02-04 DIAGNOSIS — Z01818 Encounter for other preprocedural examination: Secondary | ICD-10-CM | POA: Insufficient documentation

## 2013-02-04 DIAGNOSIS — E669 Obesity, unspecified: Secondary | ICD-10-CM

## 2013-02-04 DIAGNOSIS — Z713 Dietary counseling and surveillance: Secondary | ICD-10-CM | POA: Insufficient documentation

## 2013-02-04 NOTE — Progress Notes (Signed)
Follow-up visit:  6 Months Post-Operative RYGB Surgery  Medical Nutrition Therapy:  Appt start time:  430  End time:  500.  Primary concerns today: Post-operative Bariatric Surgery Nutrition Management. Dimas returns today with 12 lb wt loss. States he does not want to lose past 180 lbs. His % body fat is 20.2% (WNL=11-22%). Is not getting enough protein, though does not seem to be losing muscle at this point. Discussed adding protein serving(s) to his morning meal/snacks.  Surgery date: 08/17/12  Surgery type: RYGB  [facility] weight at [facility]: 258.4 lbs (11/06/11)   Weight today: 189.0 lbs  Weight change: 12.2 lbs Total weight loss: 69.4 lbs  Goal weight: 180 lbs % goal met: 97%  TANITA  BODY COMP RESULTS  11/13/11 10/09/12 11/05/12 02/04/13   BMI (kg/m^2) 37.2 30.3 28.9 27.1   Fat Mass (lbs) 90.5 59.0 -- 38.0   Fat Free Mass (lbs) 161.5 152.0 -- 151.0   Total Body Water (lbs) 118.0 111.5 -- 110.5   24-hr recall: (Works 2:30 am - 4:00 pm) B (4 AM): Strawberry/blueberry frosted mini wheat cereal (dry; 12-13 biscuits) - 7 g sugar Snk (9 AM): Strawberry/blueberry frosted mini wheat cereal (dry; 12-13 biscuits) - 7 g sugar L (12 PM): Wheat thins (21 = 1 svg) Snk (3 PM): 1/4 of rice, pinto bean chicken bowl (Bo[employer]) - 5-10g D (6 PM):  Few bites of something protein ([employer] 1/4 of large chili) - 5 g pro Snk (PM): NONE  Fluid intake:  32 oz SF Snapple tea, 24 oz water, 2 cups decaf coffee (16 oz) = ~ 70-75 oz Estimated total protein intake: ~15-25 g   Medications: No changes reported Supplementation: Taking as instructed    Using straws: No Drinking while eating: No Hair loss: No Carbonated beverages: No N/V/D/C:  Vomiting x1 from vanilla pudding with sugar alcohols Dumping syndrome: Unknown  Recent physical activity:  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   Nutritional Diagnosis:  [place]-3.3  Overweight/obesity related to past poor dietary habits and physical inactivity as evidenced by patient w/ recent RYGB surgery following dietary guidelines for continued weight loss.    Intervention:  Nutrition education/diet advancement. Stressed importance of meeting protein goals first.  Monitoring/Evaluation:  Dietary intake, exercise, and body weight. Follow up in 6 months for 12 month post-op visit or sooner if needed.

## 2013-02-04 NOTE — Patient Instructions (Addendum)
Goals:  Follow Phase 4: Low-Fat, Low-Carb Solid Food  Increase lean protein foods to meet 70-80g goal  Try to add protein to morning snack/meal  Add 15 grams of carbohydrate (fruit, whole grain, starchy vegetable) with meals  Aim for >30 min of physical activity daily

## 2013-04-28 ENCOUNTER — Ambulatory Visit (INDEPENDENT_AMBULATORY_CARE_PROVIDER_SITE_OTHER): Payer: BC Managed Care – PPO | Admitting: General Surgery

## 2013-08-05 ENCOUNTER — Ambulatory Visit: Payer: BC Managed Care – PPO | Admitting: *Deleted

## 2013-08-11 ENCOUNTER — Emergency Department (HOSPITAL_COMMUNITY): Payer: BC Managed Care – PPO

## 2013-08-11 ENCOUNTER — Encounter (HOSPITAL_COMMUNITY): Payer: Self-pay | Admitting: Emergency Medicine

## 2013-08-11 ENCOUNTER — Emergency Department (HOSPITAL_COMMUNITY)
Admission: EM | Admit: 2013-08-11 | Discharge: 2013-08-11 | Disposition: A | Discharge status: Home / Self Care | Payer: BC Managed Care – PPO | Attending: Emergency Medicine | Admitting: Emergency Medicine

## 2013-08-11 DIAGNOSIS — Z87828 Personal history of other (healed) physical injury and trauma: Secondary | ICD-10-CM | POA: Insufficient documentation

## 2013-08-11 DIAGNOSIS — N200 Calculus of kidney: Secondary | ICD-10-CM | POA: Insufficient documentation

## 2013-08-11 DIAGNOSIS — I10 Essential (primary) hypertension: Secondary | ICD-10-CM | POA: Insufficient documentation

## 2013-08-11 DIAGNOSIS — Z85828 Personal history of other malignant neoplasm of skin: Secondary | ICD-10-CM | POA: Insufficient documentation

## 2013-08-11 DIAGNOSIS — R197 Diarrhea, unspecified: Secondary | ICD-10-CM | POA: Insufficient documentation

## 2013-08-11 LAB — COMPREHENSIVE METABOLIC PANEL
Albumin: 4.6 g/dL (ref 3.5–5.2)
Alkaline Phosphatase: 77 U/L (ref 39–117)
BUN: 12 mg/dL (ref 6–23)
Chloride: 103 mEq/L (ref 96–112)
Glucose, Bld: 131 mg/dL — ABNORMAL HIGH (ref 70–99)
Potassium: 3.9 mEq/L (ref 3.5–5.1)
Total Bilirubin: 0.6 mg/dL (ref 0.3–1.2)

## 2013-08-11 LAB — URINALYSIS, ROUTINE W REFLEX MICROSCOPIC
Glucose, UA: NEGATIVE mg/dL
Ketones, ur: NEGATIVE mg/dL
Leukocytes, UA: NEGATIVE
Protein, ur: NEGATIVE mg/dL
Specific Gravity, Urine: 1.025 (ref 1.005–1.030)
Urobilinogen, UA: 0.2 mg/dL (ref 0.0–1.0)

## 2013-08-11 LAB — CBC WITH DIFFERENTIAL/PLATELET
Basophils Relative: 0 % (ref 0–1)
Hemoglobin: 15.1 g/dL (ref 13.0–17.0)
Lymphocytes Relative: 8 % — ABNORMAL LOW (ref 12–46)
Lymphs Abs: 1 10*3/uL (ref 0.7–4.0)
Monocytes Relative: 5 % (ref 3–12)
Neutro Abs: 11.1 10*3/uL — ABNORMAL HIGH (ref 1.7–7.7)
Neutrophils Relative %: 86 % — ABNORMAL HIGH (ref 43–77)
RBC: 5.12 MIL/uL (ref 4.22–5.81)

## 2013-08-11 LAB — LIPASE, BLOOD: Lipase: 29 U/L (ref 11–59)

## 2013-08-11 MED ORDER — TAMSULOSIN HCL 0.4 MG PO CAPS: 0.4000 mg | ORAL_CAPSULE | Freq: Every day | ORAL | Status: DC

## 2013-08-11 MED ORDER — HYDROMORPHONE HCL PF 1 MG/ML IJ SOLN
1.0000 mg | Freq: Once | INTRAMUSCULAR | Status: AC
  Administered 2013-08-11: 1 mg via INTRAVENOUS
  Filled 2013-08-11: qty 1

## 2013-08-11 MED ORDER — OXYCODONE-ACETAMINOPHEN 5-325 MG PO TABS: 1.0000 | ORAL_TABLET | ORAL | Status: DC | PRN

## 2013-08-11 MED ORDER — ONDANSETRON HCL 4 MG/2ML IJ SOLN
4.0000 mg | Freq: Once | INTRAMUSCULAR | Status: AC
  Administered 2013-08-11: 4 mg via INTRAVENOUS
  Filled 2013-08-11: qty 2

## 2013-08-11 NOTE — ED Provider Notes (Signed)
Medical screening examination/treatment/procedure(s) were performed by non-physician practitioner and as supervising physician I was immediately available for consultation/collaboration.    Kristina Bertone D Armaan Pond, MD 08/11/13 2344 

## 2013-08-11 NOTE — ED Notes (Signed)
Pt states started feeling constant pain in abdomen "feels like some one is squeezing and it's getting tighter and tighter". Pt states he has vomited more than 10 times in past 24 hours. Pt denies back pain. States pain is on left side.

## 2013-08-11 NOTE — ED Provider Notes (Signed)
Medical screening examination/treatment/procedure(s) were performed by non-physician practitioner and as supervising physician I was immediately available for consultation/collaboration.  Toy Baker, MD 08/11/13 [phone]

## 2013-08-11 NOTE — ED Notes (Signed)
Care transferred, report received Gabriel Rung, Charity fundraiser.

## 2013-08-11 NOTE — ED Provider Notes (Signed)
CSN: [id number]     Arrival date & time 08/11/13  0444 History   First MD Initiated Contact with Patient 08/11/13 [phone]     No chief complaint on file.   HPI  Marcus Miles is a 47 y.o. male with a PMH of HTN, headache, skin cancer, and closed head injury who presents to the ED for evaluation of abdominal pain.  History was provided by the patient.  Patient states that last night he developed sudden onset left-sided abdominal pain without radiation. He states that his pain is constant and is described as a squeezing sensation. Nothing makes his pain better or worse. He took a Tylenol with no relief of his pain. He has never had similar abdominal pain in the past. He states that he became nauseated around midnight and had 8-10 episodes of vomiting with no hematemesis. He also had 3-4 episodes of diarrhea with no hematochezia. He denies any rectal pain or bleeding. He denies any dysuria, hematuria, or back pain.  He denies any recent travel or sick contacts. He has a history of appendectomy and gastric Roux-en-Y in 08/2012 with Dr. Gaynelle Adu.     Past Medical History  Diagnosis Date  . Hypertension   . Headache(784.0)   . Skin cancer 2-3 yrs ago    left ear, nose  . Closed head injury 1989   Past Surgical History  Procedure Laterality Date  . Orif femur fracture    . Ankle surgery    . Mandible fracture surgery    . Melanoma excision    . Breath tek h pylori  12/11/2011    Procedure: BREATH TEK H PYLORI;  Surgeon: Atilano Ina, MD,FACS;  Location: [facility];  Service: General;  Laterality: N/A;  . Appendectomy  age 44 or 25  . 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 Upper gi endoscopy  08/17/2012    Procedure: UPPER GI ENDOSCOPY;  Surgeon: Atilano Ina, MD,FACS;  Location: [facility];  Service: General;;   Family History  Problem Relation Age of Onset   . Diabetes    . Hypertension    . Arthritis    . Cancer Father     lung  . Heart disease Paternal Grandmother   . Heart disease Paternal Grandfather    History  Substance Use Topics  . Smoking status: Never Smoker   . Smokeless tobacco: Never Used  . Alcohol Use: No     Comment: very rare    Review of Systems  Constitutional: Negative for fever, chills, diaphoresis, activity change, appetite change and fatigue.  HENT: Negative for congestion and rhinorrhea.   Eyes: Negative for visual disturbance.  Respiratory: Negative for cough and shortness of breath.   Cardiovascular: Negative for chest pain and leg swelling.  Gastrointestinal: Positive for nausea, vomiting and abdominal pain. Negative for diarrhea, constipation, blood in stool and rectal pain.  Genitourinary: Negative for dysuria, urgency, hematuria, decreased urine volume, scrotal swelling, difficulty urinating, penile pain and testicular pain.  Musculoskeletal: Negative for back pain and neck pain.  Skin: Negative for wound.  Neurological: Negative for dizziness, weakness, light-headedness, numbness and headaches.    Allergies  Review of patient's allergies indicates no known allergies.  Home Medications   Current Outpatient Rx  Name  Route  Sig  Dispense  Refill  . acetaminophen (TYLENOL) 500 MG tablet   Oral  Take 1,000 mg by mouth every 6 (six) hours as needed. Pain          BP 192/104  Temp(Src) 98.3 F (36.8 C) (Oral)  Resp 18  SpO2 99%  Filed Vitals:   08/11/13 0545 08/11/13 0645 08/11/13 0715 08/11/13 0830  BP: 155/94 150/95 141/97 158/90  Pulse: 64 72 75 83  Temp:   98.3 F (36.8 C)   TempSrc:   Oral   Resp:   16 17  Height:      Weight:      SpO2: 97% 94% 97% 95%     Physical Exam  Nursing note and vitals reviewed. Constitutional: He is oriented to person, place, and time. He appears well-developed and well-nourished. No distress.  Patient appears to be in pain  HENT:  Head:  Normocephalic and atraumatic.  Right Ear: External ear normal.  Left Ear: External ear normal.  Nose: Nose normal.  Mouth/Throat: Oropharynx is clear and moist. No oropharyngeal exudate.  Eyes: Conjunctivae are normal. Right eye exhibits no discharge. Left eye exhibits no discharge.  Neck: Normal range of motion. Neck supple.  Cardiovascular: Normal rate, regular rhythm, normal heart sounds and intact distal pulses.  Exam reveals no gallop and no friction rub.   No murmur heard. Dorsalis pedis pulses present and equal bilaterally.    Pulmonary/Chest: Effort normal and breath sounds normal. No respiratory distress. He has no wheezes. He has no rales. He exhibits no tenderness.  Abdominal: Soft. Bowel sounds are normal. He exhibits no distension and no mass. There is no tenderness. There is no rebound and no guarding.  Left flank tenderness to palpation  Musculoskeletal: Normal range of motion. He exhibits no edema and no tenderness.  Neurological: He is alert and oriented to person, place, and time.  Skin: Skin is warm and dry. He is not diaphoretic.    ED Course  Procedures (including critical care time) Labs Review Labs Reviewed - No data to display Imaging Review No results found.  EKG Interpretation   None      Results for orders placed during the hospital encounter of 08/11/13  CBC WITH DIFFERENTIAL      Result Value Range   WBC 12.9 (*) 4.0 - 10.5 K/uL   RBC 5.12  4.22 - 5.81 MIL/uL   Hemoglobin 15.1  13.0 - 17.0 g/dL   HCT 16.1  09.6 - 04.5 %   MCV 85.9  78.0 - 100.0 fL   MCH 29.5  26.0 - 34.0 pg   MCHC 34.3  30.0 - 36.0 g/dL   RDW 40.9  81.1 - 91.4 %   Platelets 154  150 - 400 K/uL   Neutrophils Relative % 86 (*) 43 - 77 %   Neutro Abs 11.1 (*) 1.7 - 7.7 K/uL   Lymphocytes Relative 8 (*) 12 - 46 %   Lymphs Abs 1.0  0.7 - 4.0 K/uL   Monocytes Relative 5  3 - 12 %   Monocytes Absolute 0.7  0.1 - 1.0 K/uL   Eosinophils Relative 1  0 - 5 %   Eosinophils Absolute  0.1  0.0 - 0.7 K/uL   Basophils Relative 0  0 - 1 %   Basophils Absolute 0.0  0.0 - 0.1 K/uL  COMPREHENSIVE METABOLIC PANEL      Result Value Range   Sodium 143  135 - 145 mEq/L   Potassium 3.9  3.5 - 5.1 mEq/L   Chloride 103  96 - 112 mEq/L  CO2 28  19 - 32 mEq/L   Glucose, Bld 131 (*) 70 - 99 mg/dL   BUN 12  6 - 23 mg/dL   Creatinine, Ser 4.09  0.50 - 1.35 mg/dL   Calcium 9.2  8.4 - 81.1 mg/dL   Total Protein 7.2  6.0 - 8.3 g/dL   Albumin 4.6  3.5 - 5.2 g/dL   AST 27  0 - 37 U/L   ALT 20  0 - 53 U/L   Alkaline Phosphatase 77  39 - 117 U/L   Total Bilirubin 0.6  0.3 - 1.2 mg/dL   GFR calc non Af Amer >90  >90 mL/min   GFR calc Af Amer >90  >90 mL/min  LIPASE, BLOOD      Result Value Range   Lipase 29  11 - 59 U/L       CT Abdomen Pelvis Wo Contrast (Final result)  Result time: 08/11/13 91:47:82    Final result by Rad Results In Interface (08/11/13 06:21:26)    Narrative:   CLINICAL DATA: Left-sided abdominal pain and emesis.  EXAM: CT ABDOMEN AND PELVIS WITHOUT CONTRAST  TECHNIQUE: Multidetector CT imaging of the abdomen and pelvis was performed following the standard protocol without intravenous contrast.  COMPARISON: Upper GI August 18, 2012  FINDINGS: Limited view of the lung bases are clear. Included heart and pericardium are unremarkable.  Mild left hydroureteronephrosis to the level the proximal ureter where a 6 mm calculus is seen. Mild left perinephric stranding without focal fluid collections. No residual urolithiasis. No right nephrolithiasis or hydronephrosis. Urinary bladder is well distended, unremarkable.  The liver, spleen, pancreas, adrenal glands are unremarkable. At least 5 gallstones are seen, measuring up to 12 mm, without superimposed pericholecystic inflammatory changes.  Mild misty mesentery with small mesenteric lymph nodes. No intraperitoneal free fluid nor free air. The stomach, small large bowel normal in course and caliber  without inflammatory changes. Status post gastric bypass surgery.  Great vessels are normal in course and caliber. Prostate is not enlarged. Small fat containing umbilical hernias. Apparent remote left femur intertrochanteric fracture with corticated bony fragments at greater trochanter. Scattered chronic Schmorl's nodes.  IMPRESSION: Mild left hydroureteronephrosis to the level of mid ureter where a 6 mm calculus is seen. Mild left perinephric stranding is likely reactive. No residual nephrolithiasis.  Mild misty mesentery with small lymph nodes are likely reactive, related to the left renal process.  Cholelithiasis without CT findings of acute cholecystitis.  Status post gastric bypass surgery.   Electronically Signed By: Awilda Metro On: 08/11/2013 06:21    MDM   Bernadene Bell Whittington is a 47 y.o. male with a PMH of HTN, headache, skin cancer, and closed head injury who presents to the ED for evaluation of abdominal pain.  Rechecks  6:30 AM = Informed patient of results of CT scan.  Patient's pain is well-controlled.    Etiology of abdominal pain likely due to 6 mm left kidney stone seen on CT scan.  Patient's pain well controlled in the ED.  Patient stable for discharge.  He was given referral to urology and instructed to contact the office after discharge.  No evidence of UTI.  Mild leukocytosis possibly from stress reaction.  Labs otherwise unremarkable.  Incidental findings of CT scan communicated to patient.  Return precautions discussed.  Patient in agreement with discharge and plan.     Discharge Medication List as of 08/11/2013  7:12 AM    START taking these medications   Details  oxyCODONE-acetaminophen (PERCOCET/ROXICET) 5-325 MG per tablet Take 1-2 tablets by mouth every 4 (four) hours as needed for severe pain., Starting 08/11/2013, Until Discontinued, Print    tamsulosin (FLOMAX) 0.4 MG CAPS capsule Take 1 capsule (0.4 mg total) by mouth daily., Starting  08/11/2013, Until Discontinued, Print         Final impressions: 1. Nephrolithiasis       Luiz Iron PA-C   This patient was discussed with Dr. Julio Sicks, PA-C 08/11/13 1712

## 2013-08-11 NOTE — ED Provider Notes (Signed)
Please see original HPI for indepth history.  UA without evidence of UTI.  Pt felt comfortable and stable to be discharge.  Instruct to use urine strainer and trap stone to bring to urology for further care.  Return precaution discussed.  Urology referral given.    BP 141/97  Pulse 75  Temp(Src) 98.3 F (36.8 C) (Oral)  Resp 16  Ht 5\' 10"  (1.778 m)  Wt 184 lb (83.462 kg)  BMI 26.40 kg/m2  SpO2 97%  I have reviewed nursing notes and vital signs. I personally reviewed the imaging tests through PACS system  I reviewed available ER/hospitalization records thought the EMR  Results for orders placed during the hospital encounter of 08/11/13  CBC WITH DIFFERENTIAL      Result Value Range   WBC 12.9 (*) 4.0 - 10.5 K/uL   RBC 5.12  4.22 - 5.81 MIL/uL   Hemoglobin 15.1  13.0 - 17.0 g/dL   HCT 40.9  81.1 - 91.4 %   MCV 85.9  78.0 - 100.0 fL   MCH 29.5  26.0 - 34.0 pg   MCHC 34.3  30.0 - 36.0 g/dL   RDW 78.2  95.6 - 21.3 %   Platelets 154  150 - 400 K/uL   Neutrophils Relative % 86 (*) 43 - 77 %   Neutro Abs 11.1 (*) 1.7 - 7.7 K/uL   Lymphocytes Relative 8 (*) 12 - 46 %   Lymphs Abs 1.0  0.7 - 4.0 K/uL   Monocytes Relative 5  3 - 12 %   Monocytes Absolute 0.7  0.1 - 1.0 K/uL   Eosinophils Relative 1  0 - 5 %   Eosinophils Absolute 0.1  0.0 - 0.7 K/uL   Basophils Relative 0  0 - 1 %   Basophils Absolute 0.0  0.0 - 0.1 K/uL  COMPREHENSIVE METABOLIC PANEL      Result Value Range   Sodium 143  135 - 145 mEq/L   Potassium 3.9  3.5 - 5.1 mEq/L   Chloride 103  96 - 112 mEq/L   CO2 28  19 - 32 mEq/L   Glucose, Bld 131 (*) 70 - 99 mg/dL   BUN 12  6 - 23 mg/dL   Creatinine, Ser 0.86  0.50 - 1.35 mg/dL   Calcium 9.2  8.4 - 57.8 mg/dL   Total Protein 7.2  6.0 - 8.3 g/dL   Albumin 4.6  3.5 - 5.2 g/dL   AST 27  0 - 37 U/L   ALT 20  0 - 53 U/L   Alkaline Phosphatase 77  39 - 117 U/L   Total Bilirubin 0.6  0.3 - 1.2 mg/dL   GFR calc non Af Amer >90  >90 mL/min   GFR calc Af Amer >90  >90  mL/min  LIPASE, BLOOD      Result Value Range   Lipase 29  11 - 59 U/L  URINALYSIS, ROUTINE W REFLEX MICROSCOPIC      Result Value Range   Color, Urine YELLOW  YELLOW   APPearance CLEAR  CLEAR   Specific Gravity, Urine 1.025  1.005 - 1.030   pH 7.0  5.0 - 8.0   Glucose, UA NEGATIVE  NEGATIVE mg/dL   Hgb urine dipstick NEGATIVE  NEGATIVE   Bilirubin Urine NEGATIVE  NEGATIVE   Ketones, ur NEGATIVE  NEGATIVE mg/dL   Protein, ur NEGATIVE  NEGATIVE mg/dL   Urobilinogen, UA 0.2  0.0 - 1.0 mg/dL   Nitrite NEGATIVE  NEGATIVE  Leukocytes, UA NEGATIVE  NEGATIVE   Ct Abdomen Pelvis Wo Contrast  08/11/2013   CLINICAL DATA:  Left-sided abdominal pain and emesis.  EXAM: CT ABDOMEN AND PELVIS WITHOUT CONTRAST  TECHNIQUE: Multidetector CT imaging of the abdomen and pelvis was performed following the standard protocol without intravenous contrast.  COMPARISON:  Upper GI August 18, 2012  FINDINGS: Limited view of the lung bases are clear. Included heart and pericardium are unremarkable.  Mild left hydroureteronephrosis to the level the proximal ureter where a 6 mm calculus is seen. Mild left perinephric stranding without focal fluid collections. No residual urolithiasis. No right nephrolithiasis or hydronephrosis. Urinary bladder is well distended, unremarkable.  The liver, spleen, pancreas, adrenal glands are unremarkable. At least 5 gallstones are seen, measuring up to 12 mm, without superimposed pericholecystic inflammatory changes.  Mild misty mesentery with small mesenteric lymph nodes. No intraperitoneal free fluid nor free air. The stomach, small large bowel normal in course and caliber without inflammatory changes. Status post gastric bypass surgery.  Great vessels are normal in course and caliber. Prostate is not enlarged. Small fat containing umbilical hernias. Apparent remote left femur intertrochanteric fracture with corticated bony fragments at greater trochanter. Scattered chronic Schmorl's  nodes.  IMPRESSION: Mild left hydroureteronephrosis to the level of mid ureter where a 6 mm calculus is seen. Mild left perinephric stranding is likely reactive. No residual nephrolithiasis.  Mild misty mesentery with small lymph nodes are likely reactive, related to the left renal process.  Cholelithiasis without CT findings of acute cholecystitis.  Status post gastric bypass surgery.   Electronically Signed   By: Awilda Metro   On: 08/11/2013 06:21      Fayrene Helper, PA-C 08/11/13 [phone]

## 2013-09-22 ENCOUNTER — Other Ambulatory Visit: Payer: Self-pay | Admitting: Urology

## 2013-09-27 ENCOUNTER — Encounter (HOSPITAL_COMMUNITY): Payer: Self-pay | Admitting: *Deleted

## 2013-10-08 ENCOUNTER — Encounter (HOSPITAL_COMMUNITY): Payer: Self-pay | Admitting: *Deleted

## 2013-10-08 ENCOUNTER — Ambulatory Visit (HOSPITAL_COMMUNITY)
Admission: RE | Admit: 2013-10-08 | Discharge: 2013-10-08 | Disposition: A | Discharge status: Home / Self Care | Payer: BC Managed Care – PPO | Source: Ambulatory Visit | Attending: Urology | Admitting: Urology

## 2013-10-08 ENCOUNTER — Encounter (HOSPITAL_COMMUNITY): Payer: BC Managed Care – PPO | Admitting: Certified Registered Nurse Anesthetist

## 2013-10-08 ENCOUNTER — Ambulatory Visit (HOSPITAL_COMMUNITY): Payer: BC Managed Care – PPO

## 2013-10-08 ENCOUNTER — Ambulatory Visit (HOSPITAL_COMMUNITY): Payer: BC Managed Care – PPO | Admitting: Certified Registered Nurse Anesthetist

## 2013-10-08 ENCOUNTER — Encounter (HOSPITAL_COMMUNITY)
Admission: RE | Disposition: A | Discharge status: Home / Self Care | Payer: Self-pay | Source: Ambulatory Visit | Attending: Urology

## 2013-10-08 DIAGNOSIS — N201 Calculus of ureter: Secondary | ICD-10-CM | POA: Insufficient documentation

## 2013-10-08 DIAGNOSIS — Z85828 Personal history of other malignant neoplasm of skin: Secondary | ICD-10-CM | POA: Insufficient documentation

## 2013-10-08 DIAGNOSIS — N200 Calculus of kidney: Secondary | ICD-10-CM

## 2013-10-08 DIAGNOSIS — E278 Other specified disorders of adrenal gland: Secondary | ICD-10-CM | POA: Insufficient documentation

## 2013-10-08 DIAGNOSIS — I10 Essential (primary) hypertension: Secondary | ICD-10-CM | POA: Insufficient documentation

## 2013-10-08 DIAGNOSIS — N1330 Unspecified hydronephrosis: Secondary | ICD-10-CM | POA: Insufficient documentation

## 2013-10-08 DIAGNOSIS — K8020 Calculus of gallbladder without cholecystitis without obstruction: Secondary | ICD-10-CM | POA: Insufficient documentation

## 2013-10-08 DIAGNOSIS — Z9089 Acquired absence of other organs: Secondary | ICD-10-CM | POA: Insufficient documentation

## 2013-10-08 DIAGNOSIS — Z87440 Personal history of urinary (tract) infections: Secondary | ICD-10-CM | POA: Insufficient documentation

## 2013-10-08 HISTORY — PX: HOLMIUM LASER APPLICATION: SHX5852

## 2013-10-08 HISTORY — PX: CYSTOSCOPY WITH RETROGRADE PYELOGRAM, URETEROSCOPY AND STENT PLACEMENT: SHX5789

## 2013-10-08 LAB — BASIC METABOLIC PANEL
BUN: 13 mg/dL (ref 6–23)
CHLORIDE: 104 meq/L (ref 96–112)
CO2: 28 mEq/L (ref 19–32)
Calcium: 8.8 mg/dL (ref 8.4–10.5)
Creatinine, Ser: 0.79 mg/dL (ref 0.50–1.35)
GFR calc Af Amer: >90 mL/min (ref >90)
GFR calc non Af Amer: >90 mL/min (ref >90)
GLUCOSE: 89 mg/dL (ref 70–99)
Potassium: 4.3 mEq/L (ref 3.7–5.3)
Sodium: 142 mEq/L (ref 137–147)

## 2013-10-08 LAB — CBC
HEMATOCRIT: 40.6 % (ref 39.0–52.0)
HEMOGLOBIN: 13.6 g/dL (ref 13.0–17.0)
MCH: 28.3 pg (ref 26.0–34.0)
MCHC: 33.5 g/dL (ref 30.0–36.0)
MCV: 84.6 fL (ref 78.0–100.0)
Platelets: 141 10*3/uL — ABNORMAL LOW (ref 150–400)
RBC: 4.8 MIL/uL (ref 4.22–5.81)
RDW: 12.9 % (ref 11.5–15.5)
WBC: 6.4 10*3/uL (ref 4.0–10.5)

## 2013-10-08 SURGERY — CYSTOURETEROSCOPY, WITH RETROGRADE PYELOGRAM AND STENT INSERTION
Anesthesia: General | Laterality: Left

## 2013-10-08 MED ORDER — LACTATED RINGERS IV SOLN: INTRAVENOUS | Status: DC

## 2013-10-08 MED ORDER — LACTATED RINGERS IV SOLN
INTRAVENOUS | Status: DC
  Administered 2013-10-08: 15:00:00 via INTRAVENOUS

## 2013-10-08 MED ORDER — KETOROLAC TROMETHAMINE 30 MG/ML IJ SOLN
INTRAMUSCULAR | Status: AC
  Filled 2013-10-08: qty 1

## 2013-10-08 MED ORDER — OXYCODONE HCL 5 MG PO TABS
5.0000 mg | ORAL_TABLET | ORAL | Status: DC | PRN
Start: 2013-10-08 — End: 2013-10-14

## 2013-10-08 MED ORDER — MIDAZOLAM HCL 2 MG/2ML IJ SOLN
INTRAMUSCULAR | Status: AC
  Filled 2013-10-08: qty 2

## 2013-10-08 MED ORDER — TROSPIUM CHLORIDE ER 60 MG PO CP24: 60.0000 mg | ORAL_CAPSULE | Freq: Every day | ORAL | Status: DC

## 2013-10-08 MED ORDER — SODIUM CHLORIDE 0.9 % IR SOLN
Status: DC | PRN
  Administered 2013-10-08: 1000 mL

## 2013-10-08 MED ORDER — BELLADONNA ALKALOIDS-OPIUM 16.2-60 MG RE SUPP
RECTAL | Status: DC | PRN
  Administered 2013-10-08: 1 via RECTAL

## 2013-10-08 MED ORDER — CIPROFLOXACIN HCL 500 MG PO TABS: 500.0000 mg | ORAL_TABLET | Freq: Once | ORAL | Status: DC

## 2013-10-08 MED ORDER — ONDANSETRON HCL 4 MG/2ML IJ SOLN
INTRAMUSCULAR | Status: AC
  Filled 2013-10-08: qty 2

## 2013-10-08 MED ORDER — FENTANYL CITRATE 0.05 MG/ML IJ SOLN
INTRAMUSCULAR | Status: DC | PRN
  Administered 2013-10-08 (×4): 25 ug via INTRAVENOUS

## 2013-10-08 MED ORDER — SODIUM CHLORIDE 0.9 % IR SOLN
Status: DC | PRN
  Administered 2013-10-08: 3000 mL

## 2013-10-08 MED ORDER — IOHEXOL 300 MG/ML  SOLN
INTRAMUSCULAR | Status: DC | PRN
  Administered 2013-10-08: 10 mL

## 2013-10-08 MED ORDER — FENTANYL CITRATE 0.05 MG/ML IJ SOLN
INTRAMUSCULAR | Status: AC
  Filled 2013-10-08: qty 2

## 2013-10-08 MED ORDER — PHENAZOPYRIDINE HCL 200 MG PO TABS: 200.0000 mg | ORAL_TABLET | Freq: Three times a day (TID) | ORAL | Status: DC | PRN

## 2013-10-08 MED ORDER — LIDOCAINE HCL 2 % EX GEL
CUTANEOUS | Status: DC | PRN
  Administered 2013-10-08: 1 via URETHRAL

## 2013-10-08 MED ORDER — LIDOCAINE HCL 2 % EX GEL
CUTANEOUS | Status: AC
  Filled 2013-10-08: qty 10

## 2013-10-08 MED ORDER — MIDAZOLAM HCL 5 MG/5ML IJ SOLN
INTRAMUSCULAR | Status: DC | PRN
  Administered 2013-10-08: 2 mg via INTRAVENOUS

## 2013-10-08 MED ORDER — LIDOCAINE HCL (CARDIAC) 20 MG/ML IV SOLN
INTRAVENOUS | Status: AC
  Filled 2013-10-08: qty 5

## 2013-10-08 MED ORDER — CIPROFLOXACIN IN D5W 400 MG/200ML IV SOLN
INTRAVENOUS | Status: AC
  Filled 2013-10-08: qty 200

## 2013-10-08 MED ORDER — CIPROFLOXACIN IN D5W 400 MG/200ML IV SOLN
400.0000 mg | INTRAVENOUS | Status: AC
  Administered 2013-10-08: 400 mg via INTRAVENOUS

## 2013-10-08 MED ORDER — LIDOCAINE HCL (CARDIAC) 20 MG/ML IV SOLN
INTRAVENOUS | Status: DC | PRN
  Administered 2013-10-08: 100 mg via INTRAVENOUS

## 2013-10-08 MED ORDER — FENTANYL CITRATE 0.05 MG/ML IJ SOLN: 25.0000 ug | INTRAMUSCULAR | Status: DC | PRN

## 2013-10-08 MED ORDER — KETOROLAC TROMETHAMINE 30 MG/ML IJ SOLN
INTRAMUSCULAR | Status: DC | PRN
  Administered 2013-10-08: 30 mg via INTRAVENOUS

## 2013-10-08 MED ORDER — PROPOFOL 10 MG/ML IV BOLUS
INTRAVENOUS | Status: AC
  Filled 2013-10-08: qty 20

## 2013-10-08 MED ORDER — 0.9 % SODIUM CHLORIDE (POUR BTL) OPTIME
TOPICAL | Status: DC | PRN
  Administered 2013-10-08: 1000 mL

## 2013-10-08 MED ORDER — PROPOFOL 10 MG/ML IV BOLUS
INTRAVENOUS | Status: DC | PRN
  Administered 2013-10-08: 200 mg via INTRAVENOUS

## 2013-10-08 MED ORDER — BELLADONNA ALKALOIDS-OPIUM 16.2-60 MG RE SUPP
RECTAL | Status: AC
  Filled 2013-10-08: qty 1

## 2013-10-08 MED ORDER — ONDANSETRON HCL 4 MG/2ML IJ SOLN
INTRAMUSCULAR | Status: DC | PRN
  Administered 2013-10-08: 4 mg via INTRAVENOUS

## 2013-10-08 SURGICAL SUPPLY — 21 items
APL SKNCLS STERI-STRIP NONHPOA (GAUZE/BANDAGES/DRESSINGS) ×1
BAG URO CATCHER STRL LF (DRAPE) ×2 IMPLANT
BASKET ZERO TIP NITINOL 2.4FR (BASKET) ×1 IMPLANT
BENZOIN TINCTURE PRP APPL 2/3 (GAUZE/BANDAGES/DRESSINGS) ×1 IMPLANT
BSKT STON RTRVL ZERO TP 2.4FR (BASKET) ×1
CATH CLEAR GEL 3F BACKSTOP (CATHETERS) ×1 IMPLANT
CATH URET 5FR 28IN CONE TIP (BALLOONS)
CATH URET 5FR 28IN OPEN ENDED (CATHETERS) ×1 IMPLANT
CATH URET 5FR 70CM CONE TIP (BALLOONS) ×1 IMPLANT
CLOTH BEACON ORANGE TIMEOUT ST (SAFETY) ×2 IMPLANT
DRAPE CAMERA CLOSED 9X96 (DRAPES) ×2 IMPLANT
DRSG TEGADERM 2-3/8X2-3/4 SM (GAUZE/BANDAGES/DRESSINGS) ×1 IMPLANT
FIBER LASER FLEXIVA 365 (UROLOGICAL SUPPLIES) ×1 IMPLANT
GLOVE BIOGEL M STRL SZ7.5 (GLOVE) ×2 IMPLANT
GOWN STRL REUS W/TWL XL LVL3 (GOWN DISPOSABLE) ×2 IMPLANT
GUIDEWIRE STR DUAL SENSOR (WIRE) ×2 IMPLANT
MANIFOLD NEPTUNE II (INSTRUMENTS) ×2 IMPLANT
PACK CYSTO (CUSTOM PROCEDURE TRAY) ×2 IMPLANT
STENT CONTOUR 6FRX28X.038 (STENTS) ×1 IMPLANT
TUBING CONNECTING 10 (TUBING) ×2 IMPLANT
WIRE COONS/BENSON .038X145CM (WIRE) IMPLANT

## 2013-10-08 NOTE — Interval H&P Note (Signed)
History and Physical Interval Note:  10/08/2013 2:34 PM  [name]  has presented today for surgery, with the diagnosis of Left Distal Ureteral Stone  The various methods of treatment have been discussed with the patient and family. After consideration of risks, benefits and other options for treatment, the patient has consented to  Procedure(s): CYSTOSCOPY WITH RETROGRADE PYELOGRAM, URETEROSCOPY AND STENT PLACEMENT (Left) HOLMIUM LASER APPLICATION (Left) as a surgical intervention .  The patient's history has been reviewed, patient examined, no change in status, stable for surgery.  I have reviewed the patient's chart and labs.  Questions were answered to the patient's satisfaction.     Marcus Miles

## 2013-10-08 NOTE — Transfer of Care (Signed)
Immediate Anesthesia Transfer of Care Note  Patient: Marcus Miles  Procedure(s) Performed: Procedure(s): CYSTOSCOPY WITH RETROGRADE PYELOGRAM, URETEROSCOPY AND STENT PLACEMENT (Left) HOLMIUM LASER APPLICATION (Left)  Patient Location: PACU  Anesthesia Type:General  Level of Consciousness: awake, alert  and oriented  Airway & Oxygen Therapy: Patient Spontanous Breathing and Patient connected to face mask oxygen  Post-op Assessment: Report given to PACU RN and Post -op Vital signs reviewed and stable  Post vital signs: Reviewed and stable  Complications: No apparent anesthesia complications

## 2013-10-08 NOTE — Anesthesia Preprocedure Evaluation (Signed)
Anesthesia Evaluation  Patient identified by MRN, date of birth, ID band Patient awake    Reviewed: Allergy & Precautions, H&P , NPO status , Patient's Chart, lab work & pertinent test results  Airway Mallampati: II TM Distance: >3 FB Neck ROM: full    Dental no notable dental hx. (+) Teeth Intact and Dental Advisory Given   Pulmonary neg pulmonary ROS,  breath sounds clear to auscultation  Pulmonary exam normal       Cardiovascular Exercise Tolerance: Good hypertension, Pt. on medications negative cardio ROS  Rhythm:regular Rate:Normal     Neuro/Psych negative neurological ROS  negative psych ROS   GI/Hepatic negative GI ROS, Neg liver ROS,   Endo/Other  negative endocrine ROS  Renal/GU negative Renal ROS  negative genitourinary   Musculoskeletal   Abdominal   Peds  Hematology negative hematology ROS (+)   Anesthesia Other Findings   Reproductive/Obstetrics negative OB ROS                           Anesthesia Physical Anesthesia Plan  ASA: II  Anesthesia Plan: General   Post-op Pain Management:    Induction: Intravenous  Airway Management Planned: LMA  Additional Equipment:   Intra-op Plan:   Post-operative Plan:   Informed Consent: I have reviewed the patients History and Physical, chart, labs and discussed the procedure including the risks, benefits and alternatives for the proposed anesthesia with the patient or authorized representative who has indicated his/her understanding and acceptance.   Dental Advisory Given  Plan Discussed with: CRNA and Surgeon  Anesthesia Plan Comments:         Anesthesia Quick Evaluation

## 2013-10-08 NOTE — Discharge Instructions (Signed)
DISCHARGE INSTRUCTIONS FOR KIDNEY STONES OR URETERAL STENT   MEDICATIONS:  1. DO NOT RESUME YOUR ASPIRIN, or any other medicines like ibuprofen, motrin, excedrin, advil, aleve, vitamin E, fish oil as these can all cause bleeding x 7 days.  2. Resume all your other meds from home - except do not take any other pain meds that you may have at home.  3. Take Cipro one hour prior to removal of your stent.  4. Trospium is to prevent bladder spasms and help reduce urinary frequency. 5. Pyridium is to help with the burning/stinging when you urinate. 6. Oxycodone is for moderate/severe pain, otherwise taking upto 1000mg  every 6 hours of plainTylenol will help treat your pain.  Do not take both at the same time.  ACTIVITY:  1. No strenuous activity x 1week  2. No driving while on narcotic pain medications  3. Drink plenty of water  4. Continue to walk at home - you can still get blood clots when you are at home, so keep active, but don't over do it.  5. May return to work/school tomorrow or when you feel ready   BATHING:  1. You can shower and we recommend daily showers  2. You have a string coming from your urethra: The stent string is attached to your ureteral stent. Do not pull on this.   SIGNS/SYMPTOMS TO CALL:  Please call us if you have a fever greater than 101.5, uncontrolled nausea/vomiting, uncontrolled pain, dizziness, unable to urinate, bloody urine, chest pain, shortness of breath, leg swelling, leg pain, redness around wound, drainage from wound, or any other concerns or questions.   You can reach [place] at [phone].   FOLLOW-UP:  1. You have an appointment in 6 weeks with a ultrasound of your kidneys prior.  2. You have a string attached to your stent, you may remove it on  Feb 11th, in the morning. To do this, pull the strings until the stents are completely removed. You may feel an odd sensation in your back.

## 2013-10-08 NOTE — H&P (Signed)
Reason For Visit Follow-up for left distal ureteral stone   History of Present Illness This 48 year old male who I am following closely for a left distal ureteral stone. He has been on medical expulsion therapy approximately one month. At his last appointment 2 weeks prior he was having no symptoms, a CT noncontrast was performed in the office showing persistent left UVJ stone. Patient was eager to continue with medical explosive therapy, he is not interested in any aggressive interventions.   In the interval: The patient has not noted any significant left renal colic or suprapubic pain. Denies any fevers/chills. He denies any gross hematuria or changes to his lower urinary tract symptoms he has not had any evidence of the passage of stone.   Past Medical History Problems  1. History of hypertension (V12.59) 2. 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 History of Ankle Surgery 2. History of Appendectomy 3. History of Femur Repair 4. History of Gastric Surgery 5. History of Repair Of Mandibular Body 6. History of Surgery Of Male Genitalia Vasectomy  Current Meds 1. No Reported Medications  Requested for: [id number] Recorded  Allergies Medication  1. No Known Drug Allergies  Family History Problems  1. Family history of arthritis (V17.7) 2. Family history of cardiac disorder (V17.49) : Paternal Grandmother, Paternal Grandfather 3. Family history of diabetes mellitus (V18.0) 4. Family history of hypertension (V17.49) 5. Family history of lung cancer (V16.1) : Father  Social History Problems  1. Denied: History of Alcohol use 2. Caffeine use (V49.89) 3. Death in the family, father   age 78 cancer 75. Death in the family, mother   age 68 COPD 62. Married 6. Never a smoker (V49.89) 7. Occupation   maintenance 8. Three children  Vitals Vital Signs [Data Includes: Last 1 Day]  Recorded: [id number] 03:55PM  Height: 5 ft 10 in Weight:  180 lb  BMI Calculated: 25.83 BSA Calculated: 2 Blood Pressure: 176 / 104 Temperature: 98.5 F Heart Rate: 69  Results/Data  Selected Results  AU CT-STONE PROTOCOL [id number] 12:00AM Louis Meckel   Test Name Result Flag Reference  CT-STONE PROTOCOL (Report)    ** RADIOLOGY REPORT BY [facility] **   CLINICAL DATA: Left flank pain.  EXAM: CT ABDOMEN AND PELVIS WITHOUT CONTRAST (URINARY CALCULUS PROTOCOL)  TECHNIQUE: Multidetector CT imaging was performed through the abdomen and pelvis without intravenous contrast to include the urinary tract.  COMPARISON: 08/11/2013  FINDINGS: Lung bases are clear. No evidence for free air.  No gross abnormality to the visualized liver. There are calcified gallstones present. Normal appearance of the visualized spleen. Postsurgical changes consistent with a gastric bypass procedure. Normal appearance of the pancreas. Nodularity of the right adrenal gland measuring up to 1.8 cm. Hounsfield units in this area roughly measures 20. There could be a small cyst in the right kidney upper pole. 7 mm stone in the distal left ureter but there is no significant dilatation of the left ureter or left renal collecting system. The stone has migrated from the proximal ureter since the previous examination. No evidence for bladder stones. No gross abnormality to the prostate or seminal vesicles.  There is no significant dilatation of small bowel. Moderate amount of stool in the colon. No significant free fluid or lymphadenopathy.  No acute bone abnormality.  IMPRESSION: 7 mm stone in the distal left ureter without left hydroureteronephrosis. This stone has migrated from the proximal ureter since the previous examination.  Cholelithiasis.  Stable 1.8  cm nodule in the right adrenal gland. This nodule is nonspecific but could represent an adenoma.   Electronically Signed  By: Markus Daft M.D.  On: 09/06/2013 17:17    KUB was  obtained today in clinic, indications assess for left distal renal stone. Findings renal shadows are visualized bilaterally and no evidence of renal calcifications within the renal shadows or the expected course of the ureters bilaterally until the left distal ureter where there is indeed a small calcification consistent with the calcification seen on the previous CT scan.   20 Sep 2013 3:40 PM    UA With REFLEX      COLOR YELLOW      APPEARANCE CLEAR      SPECIFIC GRAVITY 1.025      pH 6.0      GLUCOSE NEG      BILIRUBIN NEG      KETONE NEG      BLOOD NEG      PROTEIN NEG      UROBILINOGEN 0.2      NITRITE NEG      LEUKOCYTE ESTERASE NEG  Assessment Left nonobstructive distal ureteral stone   Plan Left ureteral calculus  1. Follow-up Schedule Surgery Office  Follow-up  Status: Hold For - Appointment   Requested for: [id number]  Discussion/Summary I discussed the management options with the patient, I told him that I suspected that this stone would ultimately cause some sort of complication whether the loss of kidney, renal colic, ongoing gross hematuria or recurrent urinary tract infections. As such, I recommended that we treat this stone sooner or later. The patient would like to get the stone treated in 3 weeks. We will set him up for ureteroscopy laser lithotripsy and stent placement at that time. The patient will let us know if he passes a stone in the interim.

## 2013-10-08 NOTE — Op Note (Signed)
Preoperative diagnosis: left ureteral calculus  Postoperative diagnosis: left ureteral calculus  Procedure:  1. Cystoscopy 2. left ureteroscopy and stone removal 3. Ureteroscopic laser lithotripsy 4. left 53F x 28cm ureteral stent placement  5. left retrograde pyelography with interpretation  Surgeon: Ardis Hughs, MD  Anesthesia: General  Complications: None  Intraoperative findings: left retrograde pyelography demonstrated a filling defect within the left ureter consistent with the patient's known calculus without other abnormalities.  EBL: Minimal  Specimens: 1. left ureteral calculus  Disposition of specimens: [facility] for stone analysis  Indication: Marcus Miles is a 48 y.o.   patient with urolithiasis. After reviewing the management options for treatment, the patient elected to proceed with the above surgical procedure(s). We have discussed the potential benefits and risks of the procedure, side effects of the proposed treatment, the likelihood of the patient achieving the goals of the procedure, and any potential problems that might occur during the procedure or recuperation. Informed consent has been obtained.  Description of procedure:  The patient was taken to the operating room and general anesthesia was induced.  The patient was placed in the dorsal lithotomy position, prepped and draped in the usual sterile fashion, and preoperative antibiotics were administered. A preoperative time-out was performed.   Cystourethroscopy was performed.  The patient's urethra was examined and was norma with bilobar prostatic hypertrophy. The bladder was then systematically examined in its entirety. There was no evidence for any bladder tumors, stones, or other mucosal pathology.    Attention then turned to the left ureteral orifice and a ureteral catheter was used to intubate the ureteral orifice.  Omnipaque contrast was injected through the ureteral  catheter and a retrograde pyelogram was performed with findings as dictated above.  A 0.38 sensor guidewire was then advanced up the left ureter into the renal pelvis under fluoroscopic guidance. The 6 Fr semirigid ureteroscope was then advanced into the ureter next to the guidewire and the calculus was identified.   The stone was then fragmented with the 365 micron holmium laser fiber on a setting of 0.6 and frequency of 6 Hz.   All stones were then removed from the ureter with an zero-tip nitinol basket.  Reinspection of the ureter revealed no remaining visible stones or fragments.   The wire was then backloaded through the cystoscope and a ureteral stent was advance over the wire using Seldinger technique.  The stent was positioned appropriately under fluoroscopic and cystoscopic guidance.  The wire was then removed with an adequate stent curl noted in the renal pelvis as well as in the bladder.  The bladder was then emptied and the procedure ended.  The patient appeared to tolerate the procedure well and without complications.  The patient was able to be awakened and transferred to the recovery unit in satisfactory condition.   Disposition: The tether of the stent was left on andsecured to the ventrum aspect of the patient's anus.  Instructures for removing the stent have been provided to the patient. The patient has been scheduled for followup in 6 weeks with a renal ultrasound.

## 2013-10-08 NOTE — Preoperative (Signed)
Beta Blockers   Reason not to administer Beta Blockers:Not Applicable 

## 2013-10-11 ENCOUNTER — Encounter (HOSPITAL_COMMUNITY): Payer: Self-pay | Admitting: Urology

## 2013-10-11 NOTE — Anesthesia Postprocedure Evaluation (Signed)
  Anesthesia Post-op Note  Patient: Marcus Miles  Procedure(s) Performed: Procedure(s) (LRB): CYSTOSCOPY WITH RETROGRADE PYELOGRAM, URETEROSCOPY AND STENT PLACEMENT (Left) HOLMIUM LASER APPLICATION (Left)  Patient Location: PACU  Anesthesia Type: General  Level of Consciousness: awake and alert   Airway and Oxygen Therapy: Patient Spontanous Breathing  Post-op Pain: mild  Post-op Assessment: Post-op Vital signs reviewed, Patient's Cardiovascular Status Stable, Respiratory Function Stable, Patent Airway and No signs of Nausea or vomiting  Last Vitals:  Filed Vitals:   10/08/13 1800  BP: 180/100  Pulse: 61  Temp: 36.4 C  Resp: 16    Post-op Vital Signs: stable   Complications: No apparent anesthesia complications

## 2013-10-13 ENCOUNTER — Encounter (HOSPITAL_COMMUNITY): Payer: Self-pay | Admitting: Emergency Medicine

## 2013-10-13 DIAGNOSIS — Z85828 Personal history of other malignant neoplasm of skin: Secondary | ICD-10-CM | POA: Insufficient documentation

## 2013-10-13 DIAGNOSIS — R5381 Other malaise: Secondary | ICD-10-CM | POA: Insufficient documentation

## 2013-10-13 DIAGNOSIS — R110 Nausea: Secondary | ICD-10-CM | POA: Insufficient documentation

## 2013-10-13 DIAGNOSIS — Z87828 Personal history of other (healed) physical injury and trauma: Secondary | ICD-10-CM | POA: Insufficient documentation

## 2013-10-13 DIAGNOSIS — R109 Unspecified abdominal pain: Secondary | ICD-10-CM | POA: Insufficient documentation

## 2013-10-13 DIAGNOSIS — Z79899 Other long term (current) drug therapy: Secondary | ICD-10-CM | POA: Insufficient documentation

## 2013-10-13 DIAGNOSIS — R5383 Other fatigue: Secondary | ICD-10-CM

## 2013-10-13 DIAGNOSIS — I10 Essential (primary) hypertension: Secondary | ICD-10-CM | POA: Insufficient documentation

## 2013-10-13 NOTE — ED Notes (Signed)
Pt. reports left flank pain with hematuria onset this evening , pt. stated he pulled his renal stent this afternoon , history of kidney stones . No nausea or vomitting at triage .

## 2013-10-14 ENCOUNTER — Emergency Department (HOSPITAL_COMMUNITY)
Admission: EM | Admit: 2013-10-14 | Discharge: 2013-10-14 | Disposition: A | Discharge status: Home / Self Care | Payer: BC Managed Care – PPO | Attending: Emergency Medicine | Admitting: Emergency Medicine

## 2013-10-14 DIAGNOSIS — R109 Unspecified abdominal pain: Secondary | ICD-10-CM

## 2013-10-14 LAB — CBC WITH DIFFERENTIAL/PLATELET
BASOS ABS: 0 10*3/uL (ref 0.0–0.1)
Basophils Relative: 0 % (ref 0–1)
EOS PCT: 1 % (ref 0–5)
Eosinophils Absolute: 0.1 10*3/uL (ref 0.0–0.7)
HCT: 39.8 % (ref 39.0–52.0)
Hemoglobin: 13.9 g/dL (ref 13.0–17.0)
Lymphocytes Relative: 16 % (ref 12–46)
Lymphs Abs: 1.2 10*3/uL (ref 0.7–4.0)
MCH: 29.4 pg (ref 26.0–34.0)
MCHC: 34.9 g/dL (ref 30.0–36.0)
MCV: 84.1 fL (ref 78.0–100.0)
Monocytes Absolute: 0.8 10*3/uL (ref 0.1–1.0)
Monocytes Relative: 10 % (ref 3–12)
Neutro Abs: 5.8 10*3/uL (ref 1.7–7.7)
Neutrophils Relative %: 74 % (ref 43–77)
PLATELETS: 158 10*3/uL (ref 150–400)
RBC: 4.73 MIL/uL (ref 4.22–5.81)
RDW: 12.6 % (ref 11.5–15.5)
WBC: 7.9 10*3/uL (ref 4.0–10.5)

## 2013-10-14 LAB — URINALYSIS, ROUTINE W REFLEX MICROSCOPIC
Bilirubin Urine: NEGATIVE
Glucose, UA: NEGATIVE mg/dL
Ketones, ur: NEGATIVE mg/dL
Nitrite: NEGATIVE
PROTEIN: 100 mg/dL — AB
Specific Gravity, Urine: 1.027 (ref 1.005–1.030)
UROBILINOGEN UA: 0.2 mg/dL (ref 0.0–1.0)
pH: 7 (ref 5.0–8.0)

## 2013-10-14 LAB — POCT I-STAT, CHEM 8
BUN: 14 mg/dL (ref 6–23)
Calcium, Ion: 1.19 mmol/L (ref 1.12–1.23)
Chloride: 101 mEq/L (ref 96–112)
Creatinine, Ser: 0.7 mg/dL (ref 0.50–1.35)
Glucose, Bld: 115 mg/dL — ABNORMAL HIGH (ref 70–99)
HCT: 42 % (ref 39.0–52.0)
HEMOGLOBIN: 14.3 g/dL (ref 13.0–17.0)
POTASSIUM: 4.1 meq/L (ref 3.7–5.3)
SODIUM: 142 meq/L (ref 137–147)
TCO2: 25 mmol/L (ref 0–100)

## 2013-10-14 LAB — URINE MICROSCOPIC-ADD ON

## 2013-10-14 MED ORDER — OXYCODONE-ACETAMINOPHEN 5-325 MG PO TABS: 1.0000 | ORAL_TABLET | Freq: Four times a day (QID) | ORAL | Status: AC | PRN

## 2013-10-14 MED ORDER — ONDANSETRON 4 MG PO TBDP: 4.0000 mg | ORAL_TABLET | Freq: Three times a day (TID) | ORAL | Status: AC | PRN

## 2013-10-14 MED ORDER — SODIUM CHLORIDE 0.9 % IV BOLUS (SEPSIS)
1000.0000 mL | Freq: Once | INTRAVENOUS | Status: AC
  Administered 2013-10-14: 1000 mL via INTRAVENOUS

## 2013-10-14 MED ORDER — HYDROMORPHONE HCL PF 1 MG/ML IJ SOLN
1.0000 mg | Freq: Once | INTRAMUSCULAR | Status: AC
  Administered 2013-10-14: 1 mg via INTRAVENOUS
  Filled 2013-10-14: qty 1

## 2013-10-14 NOTE — ED Provider Notes (Signed)
Medical screening examination/treatment/procedure(s) were performed by non-physician practitioner and as supervising physician I was immediately available for consultation/collaboration.    Kalman Drape, MD 10/14/13 [phone]

## 2013-10-14 NOTE — Discharge Instructions (Signed)
Flank Pain Flank pain refers to pain that is located on the side of the body between the upper abdomen and the back. The pain may occur over a short period of time (acute) or may be long-term or reoccurring (chronic). It may be mild or severe. Flank pain can be caused by many things. CAUSES  Some of the more common causes of flank pain include:  Muscle strains.   Muscle spasms.   A disease of your spine (vertebral disk disease).   A lung infection (pneumonia).   Fluid around your lungs (pulmonary edema).   A kidney infection.   Kidney stones.   A very painful skin rash caused by the chickenpox virus (shingles).   Gallbladder disease.  [place] care will depend on the cause of your pain. In general,  Rest as directed by your caregiver.  Drink enough fluids to keep your urine clear or pale yellow.  Only take over-the-counter or prescription medicines as directed by your caregiver. Some medicines may help relieve the pain.  Tell your caregiver about any changes in your pain.  Follow up with your caregiver as directed. SEEK IMMEDIATE MEDICAL CARE IF:   Your pain is not controlled with medicine.   You have new or worsening symptoms.  Your pain increases.   You have abdominal pain.   You have shortness of breath.   You have persistent nausea or vomiting.   You have swelling in your abdomen.   You feel faint or pass out.   You have blood in your urine.  You have a fever or persistent symptoms for more than 2 3 days.  You have a fever and your symptoms suddenly get worse. MAKE SURE YOU:   Understand these instructions.  Will watch your condition.  Will get help right away if you are not doing well or get worse. Document Released: 10/10/2005 Document Revised: 05/13/2012 Document Reviewed: 04/02/2012 [facility] Patient Information 2014 [place]. Tonight.  Your urine has several white cells in it and numerous red cells as  a result of removing the ureteral stent.  It has been, cultured, because at this time.  I do not feel that you have a urinary tract infection.  You have been given prescription for Percocet for pain control, as well as Zofran for any nausea, you may develop I would like you to call your urologist, today for further advice on treatment

## 2013-10-14 NOTE — ED Provider Notes (Signed)
CSN: [id number]     Arrival date & time 10/13/13  2320 History   First MD Initiated Contact with Patient 10/14/13 0227     Chief Complaint  Patient presents with  . Flank Pain     (Consider location/radiation/quality/duration/timing/severity/associated sxs/prior Treatment) HPI Comments: Your kidney stone in December 2014.  He had a stent placed by urology on February 10th.  He was instructed to remove the stent yesterday, which he did shortly thereafter, he developed left-sided abdominal pain, radiating to his flank that was as bad or worse than the kidney stone.  He also had some nausea and vomiting. He took Tylenol without any relief  Patient is a 48 y.o. male presenting with flank pain. The history is provided by the patient.  Flank Pain This is a new problem. The current episode started today. The problem has been unchanged. Associated symptoms include abdominal pain, nausea and weakness. Pertinent negatives include no chest pain, coughing, fever or rash. Nothing aggravates the symptoms. He has tried acetaminophen for the symptoms. The treatment provided no relief.    Past Medical History  Diagnosis Date  . Hypertension   . Headache(784.0)   . Skin cancer 2-3 yrs ago    left ear, nose  . Closed head injury 1989   Past Surgical History  Procedure Laterality Date  . Orif femur fracture    . Ankle surgery    . Mandible fracture surgery    . Melanoma excision    . Breath tek h pylori  12/11/2011    Procedure: BREATH TEK H PYLORI;  Surgeon: Gayland Curry, MD,FACS;  Location: [facility];  Service: General;  Laterality: N/A;  . Appendectomy  age 46 or 64  . 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 Upper gi endoscopy  08/17/2012    Procedure: UPPER GI ENDOSCOPY;  Surgeon: Gayland Curry, MD,FACS;  Location: [facility];  Service: General;;  . Cystoscopy with  retrograde pyelogram, ureteroscopy and stent placement Left 10/08/2013    Procedure: [place], URETEROSCOPY AND STENT PLACEMENT;  Surgeon: Ardis Hughs, MD;  Location: [facility];  Service: Urology;  Laterality: Left;  . Holmium laser application Left 10/09/8339    Procedure: HOLMIUM LASER APPLICATION;  Surgeon: Ardis Hughs, MD;  Location: [facility];  Service: Urology;  Laterality: Left;  . Kidney stones     Family History  Problem Relation Age of Onset  . Diabetes    . Hypertension    . Arthritis    . Cancer Father     lung  . Heart disease Paternal Grandmother   . Heart disease Paternal Grandfather    History  Substance Use Topics  . Smoking status: Never Smoker   . Smokeless tobacco: Never Used  . Alcohol Use: No     Comment: very rare    Review of Systems  Constitutional: Negative for fever.  Respiratory: Negative for cough.   Cardiovascular: Negative for chest pain and leg swelling.  Gastrointestinal: Positive for nausea and abdominal pain.  Genitourinary: Positive for flank pain. Negative for dysuria and frequency.  Skin: Negative for pallor and rash.  Neurological: Positive for weakness.  All other systems reviewed and are negative.      Allergies  Review of patient's allergies indicates no known allergies.  Home Medications   Current Outpatient Rx  Name  Route  Sig  Dispense  Refill  . acetaminophen (TYLENOL) 500 MG tablet   Oral   Take 1,000 mg by mouth every 6 (six) hours as needed for moderate pain.         . ferrous sulfate 325 (65 FE) MG tablet   Oral   Take 325 mg by mouth daily with breakfast.         . Multiple Vitamin (MULTIVITAMIN WITH MINERALS) TABS tablet   Oral   Take 1 tablet by mouth daily.         . vitamin B-12 (CYANOCOBALAMIN) 1000 MCG tablet   Oral   Take 1,000 mcg by mouth daily.         . ondansetron (ZOFRAN ODT) 4 MG disintegrating tablet   Oral   Take 1 tablet (4 mg total) by mouth  every 8 (eight) hours as needed for nausea or vomiting.   20 tablet   0   . oxyCODONE-acetaminophen (PERCOCET/ROXICET) 5-325 MG per tablet   Oral   Take 1-2 tablets by mouth every 6 (six) hours as needed for severe pain.   20 tablet   0    BP 136/80  Pulse 61  Temp(Src) 97.8 F (36.6 C) (Oral)  Resp 20  Wt 193 lb 2 oz (87.601 kg)  SpO2 94% Physical Exam  Vitals reviewed. Constitutional: He appears well-developed and well-nourished.  HENT:  Head: Normocephalic.  Eyes: Pupils are equal, round, and reactive to light.  Neck: Normal range of motion.  Cardiovascular: Normal rate and regular rhythm.   Pulmonary/Chest: Effort normal and breath sounds normal.  Abdominal: Soft. Bowel sounds are normal. He exhibits no distension. There is no tenderness.  Patient was examined after he had received pain medication.  He is in no distress at this time  Musculoskeletal: Normal range of motion. He exhibits no edema and no tenderness.  Neurological: He is alert.  Skin: Skin is warm.    ED Course  Procedures (including critical care time) Labs Review Labs Reviewed  URINALYSIS, ROUTINE W REFLEX MICROSCOPIC - Abnormal; Notable for the following:    APPearance CLOUDY (*)    Hgb urine dipstick LARGE (*)    Protein, ur 100 (*)    Leukocytes, UA SMALL (*)    All other components within normal limits  POCT I-STAT, CHEM 8 - Abnormal; Notable for the following:    Glucose, Bld 115 (*)    All other components within normal limits  URINE CULTURE  CBC WITH DIFFERENTIAL  URINE MICROSCOPIC-ADD ON   Imaging Review No results found.  EKG Interpretation   None       MDM   Final diagnoses:  Lt flank pain   patient's labs, have reviewed.  His BUN and creatinine are normally does not have any indication of an infection.  His urine was reviewed.  He has many red cells, which is a result of removing the ureteral stent and a few white cells, but no bacteria.  His urine has been, cultured.  I  have not started him on antibiotic at this point.  He did take one dose of antibiotics prior to the removal of his stent per his urologist.  I recommend that he call his urologist today for further evaluation and a more concise.  Treatment plan       Garald Balding, NP 10/14/13 9604  Garald Balding, NP 10/14/13 [phone]

## 2013-10-15 LAB — URINE CULTURE
COLONY COUNT: NO GROWTH
Culture: NO GROWTH

## 2015-07-12 ENCOUNTER — Other Ambulatory Visit: Payer: Self-pay | Admitting: Family Medicine

## 2015-07-12 DIAGNOSIS — H53133 Sudden visual loss, bilateral: Secondary | ICD-10-CM

## 2015-07-14 ENCOUNTER — Ambulatory Visit
Admission: RE | Admit: 2015-07-14 | Discharge: 2015-07-14 | Disposition: A | Discharge status: Home / Self Care | Payer: BLUE CROSS/BLUE SHIELD | Source: Ambulatory Visit | Attending: Family Medicine | Admitting: Family Medicine

## 2015-07-14 DIAGNOSIS — H53133 Sudden visual loss, bilateral: Secondary | ICD-10-CM

## 2015-07-14 MED ORDER — GADOBENATE DIMEGLUMINE 529 MG/ML IV SOLN
18.0000 mL | Freq: Once | INTRAVENOUS | Status: AC | PRN
  Administered 2015-07-14: 18 mL via INTRAVENOUS

## 2015-07-17 ENCOUNTER — Ambulatory Visit
Admission: RE | Admit: 2015-07-17 | Discharge: 2015-07-17 | Disposition: A | Discharge status: Home / Self Care | Payer: BLUE CROSS/BLUE SHIELD | Source: Ambulatory Visit | Attending: Family Medicine | Admitting: Family Medicine

## 2015-07-17 DIAGNOSIS — H53133 Sudden visual loss, bilateral: Secondary | ICD-10-CM

## 2016-03-20 DIAGNOSIS — Z808 Family history of malignant neoplasm of other organs or systems: Secondary | ICD-10-CM | POA: Diagnosis not present

## 2016-03-20 DIAGNOSIS — Z85828 Personal history of other malignant neoplasm of skin: Secondary | ICD-10-CM | POA: Diagnosis not present

## 2016-03-20 DIAGNOSIS — D225 Melanocytic nevi of trunk: Secondary | ICD-10-CM | POA: Diagnosis not present

## 2016-03-20 DIAGNOSIS — Z86018 Personal history of other benign neoplasm: Secondary | ICD-10-CM | POA: Diagnosis not present

## 2016-07-05 DIAGNOSIS — G441 Vascular headache, not elsewhere classified: Secondary | ICD-10-CM | POA: Diagnosis not present

## 2016-07-05 DIAGNOSIS — I10 Essential (primary) hypertension: Secondary | ICD-10-CM | POA: Diagnosis not present

## 2016-07-05 DIAGNOSIS — Z23 Encounter for immunization: Secondary | ICD-10-CM | POA: Diagnosis not present

## 2016-07-17 DIAGNOSIS — I10 Essential (primary) hypertension: Secondary | ICD-10-CM | POA: Diagnosis not present

## 2016-09-03 DIAGNOSIS — J3489 Other specified disorders of nose and nasal sinuses: Secondary | ICD-10-CM | POA: Diagnosis not present

## 2016-09-03 DIAGNOSIS — R0989 Other specified symptoms and signs involving the circulatory and respiratory systems: Secondary | ICD-10-CM | POA: Diagnosis not present

## 2016-09-03 DIAGNOSIS — R05 Cough: Secondary | ICD-10-CM | POA: Diagnosis not present

## 2016-09-03 DIAGNOSIS — J209 Acute bronchitis, unspecified: Secondary | ICD-10-CM | POA: Diagnosis not present

## 2016-10-03 DIAGNOSIS — L570 Actinic keratosis: Secondary | ICD-10-CM | POA: Diagnosis not present

## 2016-10-03 DIAGNOSIS — Z808 Family history of malignant neoplasm of other organs or systems: Secondary | ICD-10-CM | POA: Diagnosis not present

## 2016-10-03 DIAGNOSIS — D0439 Carcinoma in situ of skin of other parts of face: Secondary | ICD-10-CM | POA: Diagnosis not present

## 2016-10-03 DIAGNOSIS — Z86018 Personal history of other benign neoplasm: Secondary | ICD-10-CM | POA: Diagnosis not present

## 2016-10-03 DIAGNOSIS — D485 Neoplasm of uncertain behavior of skin: Secondary | ICD-10-CM | POA: Diagnosis not present

## 2016-10-03 DIAGNOSIS — D225 Melanocytic nevi of trunk: Secondary | ICD-10-CM | POA: Diagnosis not present

## 2016-10-03 DIAGNOSIS — D2271 Melanocytic nevi of right lower limb, including hip: Secondary | ICD-10-CM | POA: Diagnosis not present

## 2016-11-14 DIAGNOSIS — D0439 Carcinoma in situ of skin of other parts of face: Secondary | ICD-10-CM | POA: Diagnosis not present

## 2017-03-27 ENCOUNTER — Encounter (HOSPITAL_COMMUNITY): Payer: Self-pay

## 2017-05-02 DIAGNOSIS — S6991XA Unspecified injury of right wrist, hand and finger(s), initial encounter: Secondary | ICD-10-CM | POA: Diagnosis not present

## 2017-05-02 DIAGNOSIS — Z23 Encounter for immunization: Secondary | ICD-10-CM | POA: Diagnosis not present

## 2017-05-02 DIAGNOSIS — I10 Essential (primary) hypertension: Secondary | ICD-10-CM | POA: Diagnosis not present

## 2017-05-02 DIAGNOSIS — G43009 Migraine without aura, not intractable, without status migrainosus: Secondary | ICD-10-CM | POA: Diagnosis not present

## 2017-05-02 DIAGNOSIS — T753XXS Motion sickness, sequela: Secondary | ICD-10-CM | POA: Diagnosis not present

## 2017-05-06 IMAGING — US US CAROTID DUPLEX BILAT
1 series · 13 of 24 positions shown · non-contrast
Comparison: None.

CLINICAL DATA: Amaurosis fugax

EXAM:
BILATERAL CAROTID DUPLEX ULTRASOUND
TECHNIQUE: Gray scale imaging, color Doppler and duplex ultrasound were
performed of bilateral carotid and vertebral arteries in the neck.

[Series 1: us carotid duplex bilat · 0.07mm/px · 13 of 64 slices shown]
[im 1/64]
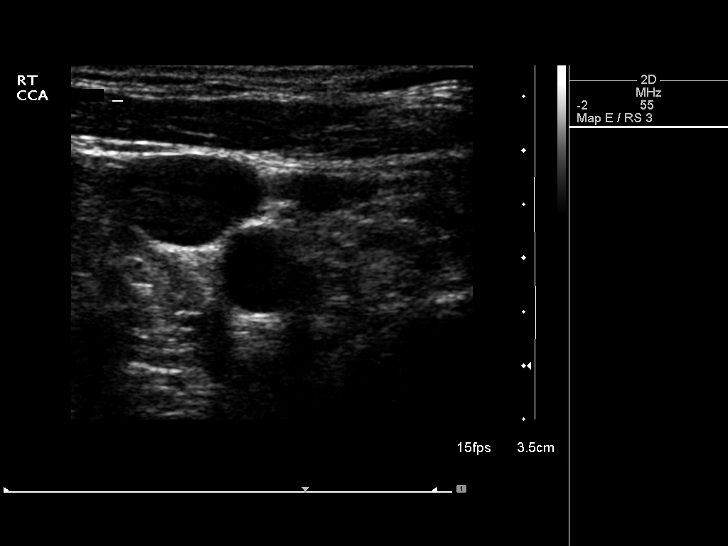
[im 6/64]
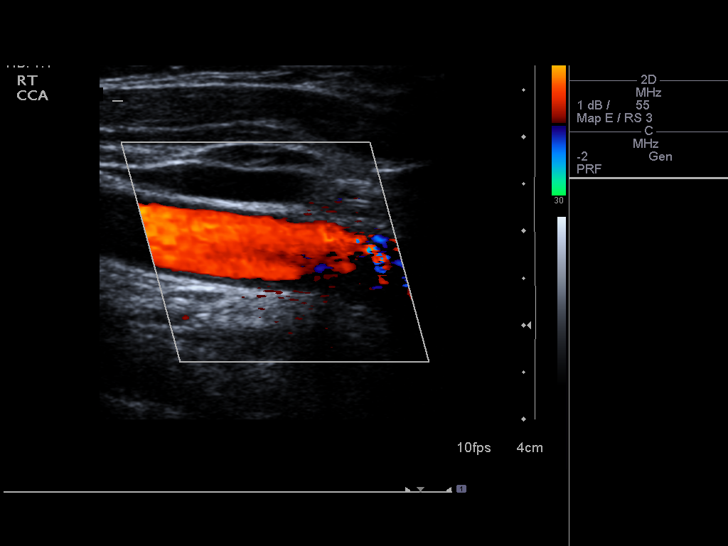
[im 11/64]
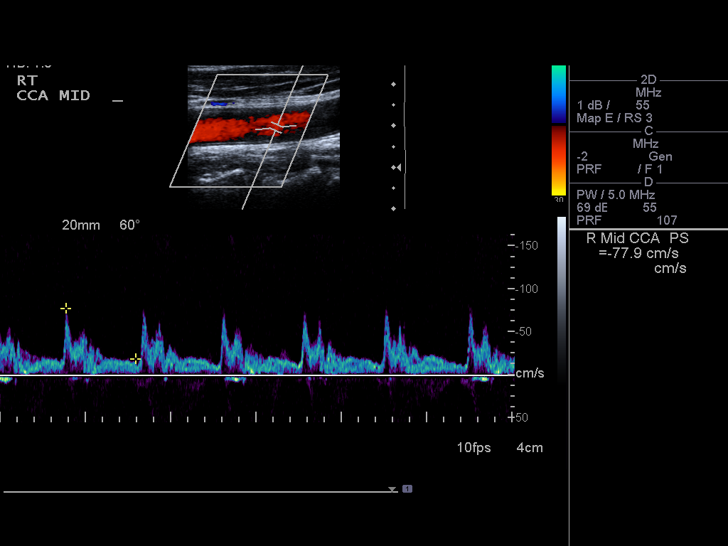
[im 17/64]
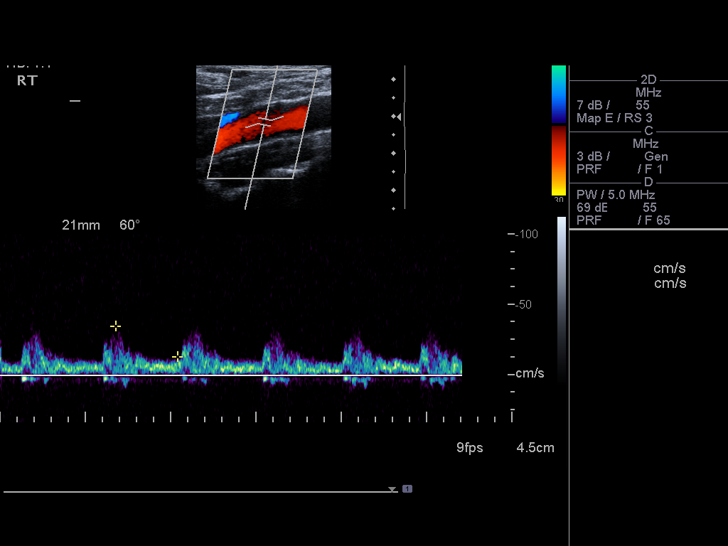
[im 22/64]
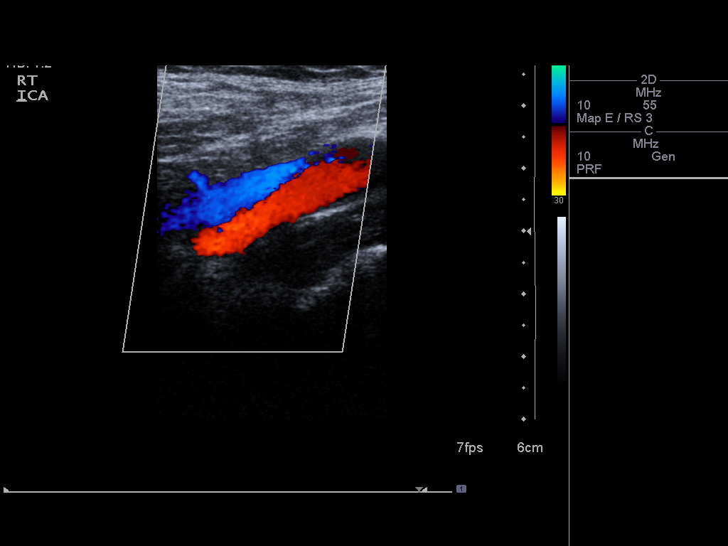
[im 28/64]
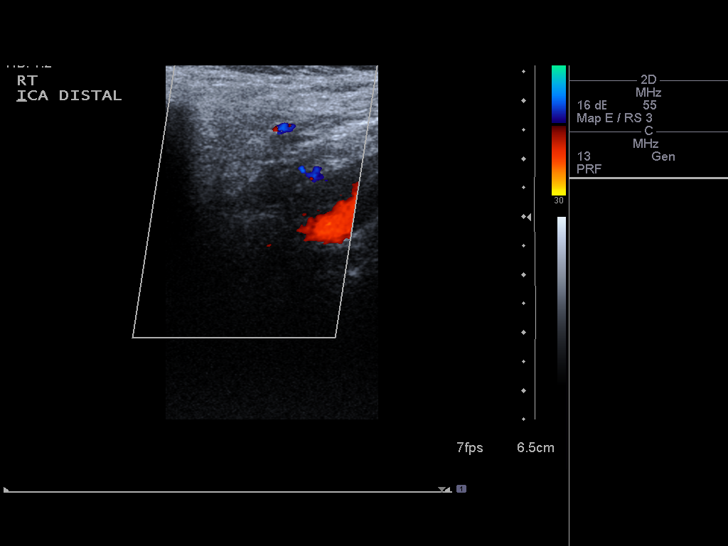
[im 33/64]
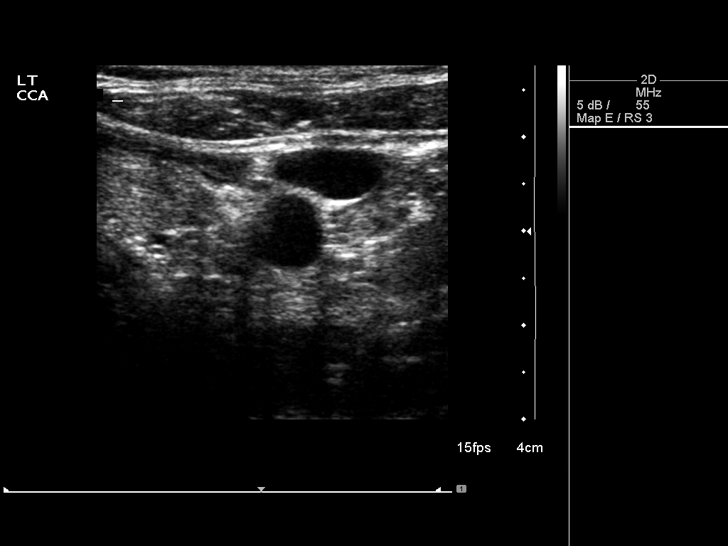
[im 36/64]
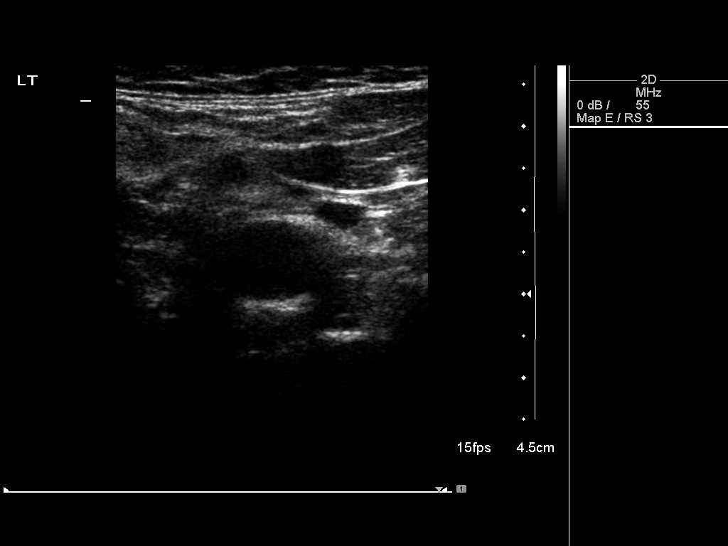
[im 42/64]
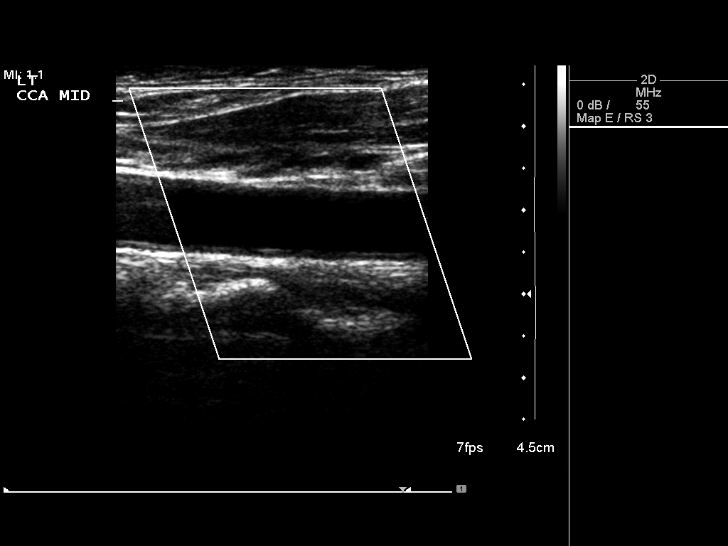
[im 47/64]
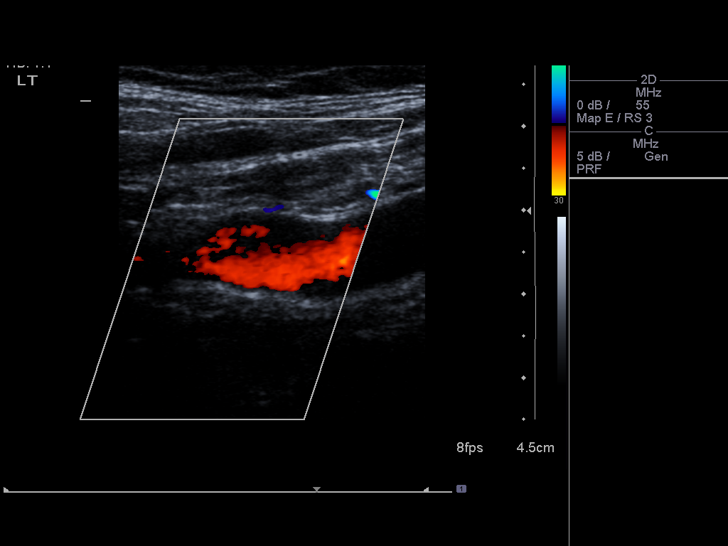
[im 53/64]
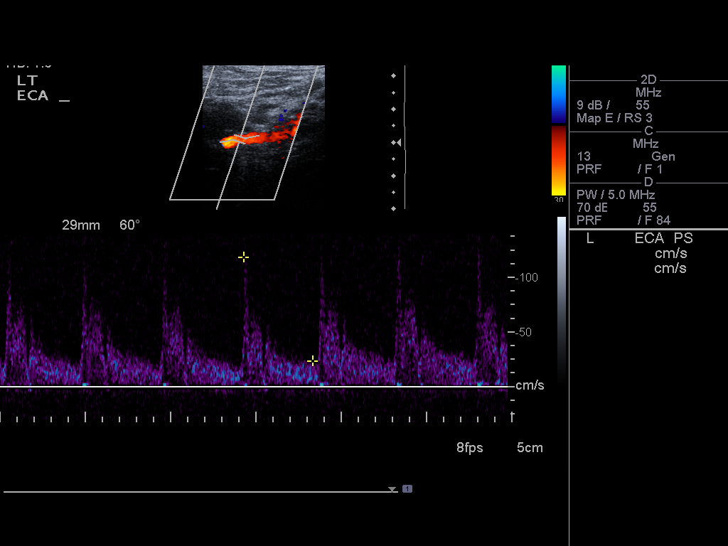
[im 58/64]
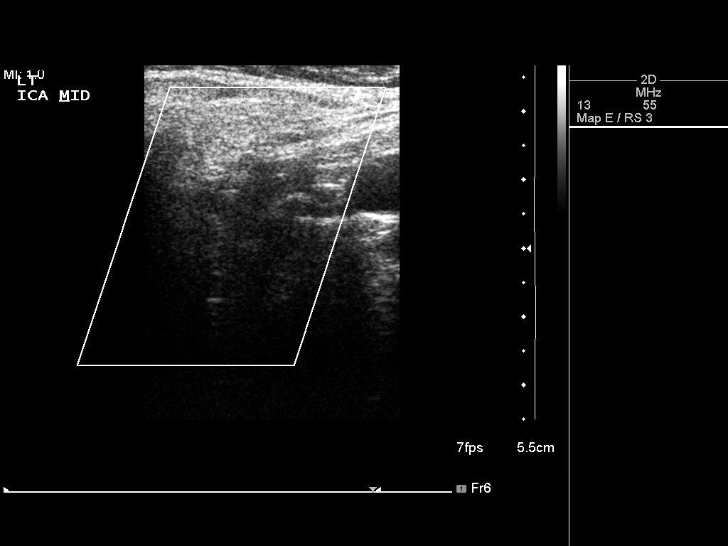
[im 64/64]
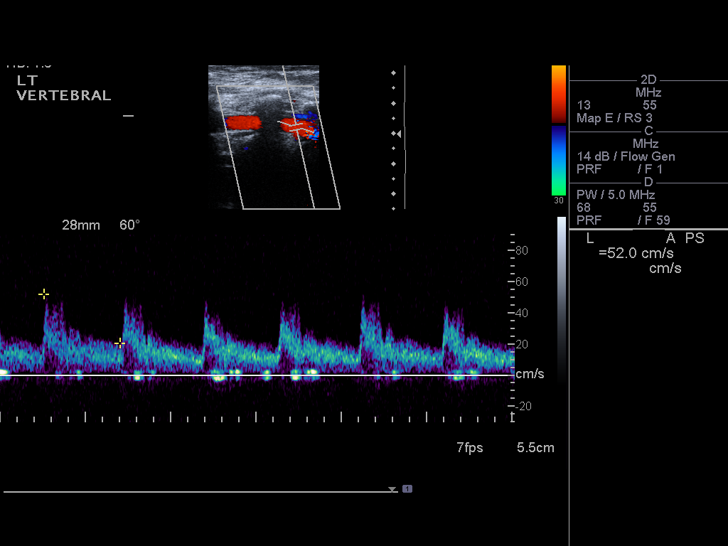

[13 of 24 positions shown; findings below may reference images not displayed]

FINDINGS: Criteria: Quantification of carotid stenosis is based on velocity
parameters that correlate the residual internal carotid diameter
with NASCET-based stenosis levels, using the diameter of the distal
internal carotid lumen as the denominator for stenosis measurement.

The following velocity measurements were obtained:

RIGHT

ICA:  62 cm/sec

CCA:  110 cm/sec

SYSTOLIC ICA/CCA RATIO:

DIASTOLIC ICA/CCA RATIO:

ECA:  79 cm/sec

LEFT

ICA:  64 cm/sec

CCA:  1 away cm/sec

SYSTOLIC ICA/CCA RATIO:

DIASTOLIC ICA/CCA RATIO:

ECA:  119 cm/sec

RIGHT CAROTID ARTERY: Little if any plaque in the bulb. Low
resistance internal carotid Doppler pattern.

RIGHT VERTEBRAL ARTERY:  Antegrade with a normal Doppler pattern.

LEFT CAROTID ARTERY: Little if any plaque in the bulb. Low
resistance internal carotid Doppler pattern.

LEFT VERTEBRAL ARTERY:  Antegrade with a normal Doppler pattern.
IMPRESSION: Less than 50% stenosis in the right and left internal carotid
arteries.

## 2017-05-07 DIAGNOSIS — Z85828 Personal history of other malignant neoplasm of skin: Secondary | ICD-10-CM | POA: Diagnosis not present

## 2017-05-07 DIAGNOSIS — Z808 Family history of malignant neoplasm of other organs or systems: Secondary | ICD-10-CM | POA: Diagnosis not present

## 2017-05-07 DIAGNOSIS — D2271 Melanocytic nevi of right lower limb, including hip: Secondary | ICD-10-CM | POA: Diagnosis not present

## 2017-05-07 DIAGNOSIS — D225 Melanocytic nevi of trunk: Secondary | ICD-10-CM | POA: Diagnosis not present

## 2017-05-07 DIAGNOSIS — Z23 Encounter for immunization: Secondary | ICD-10-CM | POA: Diagnosis not present

## 2017-12-16 DIAGNOSIS — L559 Sunburn, unspecified: Secondary | ICD-10-CM | POA: Diagnosis not present

## 2017-12-16 DIAGNOSIS — L570 Actinic keratosis: Secondary | ICD-10-CM | POA: Diagnosis not present

## 2017-12-16 DIAGNOSIS — D225 Melanocytic nevi of trunk: Secondary | ICD-10-CM | POA: Diagnosis not present

## 2017-12-16 DIAGNOSIS — L821 Other seborrheic keratosis: Secondary | ICD-10-CM | POA: Diagnosis not present

## 2017-12-16 DIAGNOSIS — D2271 Melanocytic nevi of right lower limb, including hip: Secondary | ICD-10-CM | POA: Diagnosis not present

## 2018-03-20 DIAGNOSIS — Z125 Encounter for screening for malignant neoplasm of prostate: Secondary | ICD-10-CM | POA: Diagnosis not present

## 2018-03-20 DIAGNOSIS — I10 Essential (primary) hypertension: Secondary | ICD-10-CM | POA: Diagnosis not present

## 2018-03-20 DIAGNOSIS — F5101 Primary insomnia: Secondary | ICD-10-CM | POA: Diagnosis not present

## 2018-03-20 DIAGNOSIS — G43009 Migraine without aura, not intractable, without status migrainosus: Secondary | ICD-10-CM | POA: Diagnosis not present

## 2018-06-24 DIAGNOSIS — Z23 Encounter for immunization: Secondary | ICD-10-CM | POA: Diagnosis not present

## 2018-06-24 DIAGNOSIS — Z808 Family history of malignant neoplasm of other organs or systems: Secondary | ICD-10-CM | POA: Diagnosis not present

## 2018-06-24 DIAGNOSIS — L570 Actinic keratosis: Secondary | ICD-10-CM | POA: Diagnosis not present

## 2018-06-24 DIAGNOSIS — Z86018 Personal history of other benign neoplasm: Secondary | ICD-10-CM | POA: Diagnosis not present

## 2018-06-24 DIAGNOSIS — D485 Neoplasm of uncertain behavior of skin: Secondary | ICD-10-CM | POA: Diagnosis not present

## 2018-06-24 DIAGNOSIS — D225 Melanocytic nevi of trunk: Secondary | ICD-10-CM | POA: Diagnosis not present

## 2018-06-24 DIAGNOSIS — Z85828 Personal history of other malignant neoplasm of skin: Secondary | ICD-10-CM | POA: Diagnosis not present

## 2019-04-14 DIAGNOSIS — I10 Essential (primary) hypertension: Secondary | ICD-10-CM | POA: Diagnosis not present

## 2019-04-14 DIAGNOSIS — J4520 Mild intermittent asthma, uncomplicated: Secondary | ICD-10-CM | POA: Diagnosis not present

## 2019-04-14 DIAGNOSIS — G43009 Migraine without aura, not intractable, without status migrainosus: Secondary | ICD-10-CM | POA: Diagnosis not present

## 2019-04-14 DIAGNOSIS — F5101 Primary insomnia: Secondary | ICD-10-CM | POA: Diagnosis not present

## 2019-04-15 DIAGNOSIS — I10 Essential (primary) hypertension: Secondary | ICD-10-CM | POA: Diagnosis not present

## 2019-04-15 DIAGNOSIS — Z125 Encounter for screening for malignant neoplasm of prostate: Secondary | ICD-10-CM | POA: Diagnosis not present

## 2019-07-28 DIAGNOSIS — Z20828 Contact with and (suspected) exposure to other viral communicable diseases: Secondary | ICD-10-CM | POA: Diagnosis not present

## 2019-11-06 ENCOUNTER — Ambulatory Visit: Payer: BLUE CROSS/BLUE SHIELD

## 2019-11-07 ENCOUNTER — Ambulatory Visit: Payer: BC Managed Care – PPO | Attending: Internal Medicine

## 2019-11-07 DIAGNOSIS — Z23 Encounter for immunization: Secondary | ICD-10-CM

## 2019-11-07 NOTE — Progress Notes (Signed)
   Covid-19 Vaccination Clinic  Name:  Marcus Miles [facility]    MRN: [id number] DOB: 01-Jun-1966  11/07/2019  Marcus Miles was observed post Covid-19 immunization for 15 minutes without incident. He was provided with Vaccine Information Sheet and instruction to access the V-Safe system.   Marcus Miles was instructed to call 911 with any severe reactions post vaccine: Marland Kitchen Difficulty breathing  . Swelling of face and throat  . A fast heartbeat  . A bad rash all over body  . Dizziness and weakness   Immunizations Administered    Name Date Dose VIS Date Route   Pfizer COVID-19 Vaccine 11/07/2019  3:07 PM 0.3 mL 08/13/2019 Intramuscular   Manufacturer: [place]   Lot: [id number]   [place]: [id number]

## 2019-11-12 DIAGNOSIS — S0501XA Injury of conjunctiva and corneal abrasion without foreign body, right eye, initial encounter: Secondary | ICD-10-CM | POA: Diagnosis not present

## 2019-12-08 ENCOUNTER — Ambulatory Visit: Payer: BC Managed Care – PPO | Attending: Internal Medicine

## 2019-12-08 DIAGNOSIS — Z23 Encounter for immunization: Secondary | ICD-10-CM

## 2019-12-08 NOTE — Progress Notes (Signed)
   Covid-19 Vaccination Clinic  Name:  Marcus Miles [facility]    MRN: [id number] DOB: June 06, 1966  12/08/2019  Marcus Miles was observed post Covid-19 immunization for 15 minutes without incident. He was provided with Vaccine Information Sheet and instruction to access the V-Safe system.   Marcus Miles was instructed to call 911 with any severe reactions post vaccine: Marland Kitchen Difficulty breathing  . Swelling of face and throat  . A fast heartbeat  . A bad rash all over body  . Dizziness and weakness   Immunizations Administered    Name Date Dose VIS Date Route   Pfizer COVID-19 Vaccine 12/08/2019  8:51 AM 0.3 mL 08/13/2019 Intramuscular   Manufacturer: [place]   Lot: [id number]   [place]: [id number]

## 2019-12-13 DIAGNOSIS — S0501XA Injury of conjunctiva and corneal abrasion without foreign body, right eye, initial encounter: Secondary | ICD-10-CM | POA: Diagnosis not present

## 2019-12-20 DIAGNOSIS — T1501XA Foreign body in cornea, right eye, initial encounter: Secondary | ICD-10-CM | POA: Diagnosis not present

## 2020-07-25 DIAGNOSIS — G43009 Migraine without aura, not intractable, without status migrainosus: Secondary | ICD-10-CM | POA: Diagnosis not present

## 2020-07-25 DIAGNOSIS — I10 Essential (primary) hypertension: Secondary | ICD-10-CM | POA: Diagnosis not present

## 2020-07-25 DIAGNOSIS — J4520 Mild intermittent asthma, uncomplicated: Secondary | ICD-10-CM | POA: Diagnosis not present

## 2020-07-25 DIAGNOSIS — F5101 Primary insomnia: Secondary | ICD-10-CM | POA: Diagnosis not present

## 2020-08-22 DIAGNOSIS — I10 Essential (primary) hypertension: Secondary | ICD-10-CM | POA: Diagnosis not present

## 2020-09-27 DIAGNOSIS — L821 Other seborrheic keratosis: Secondary | ICD-10-CM | POA: Diagnosis not present

## 2020-09-27 DIAGNOSIS — L570 Actinic keratosis: Secondary | ICD-10-CM | POA: Diagnosis not present

## 2020-09-27 DIAGNOSIS — D2271 Melanocytic nevi of right lower limb, including hip: Secondary | ICD-10-CM | POA: Diagnosis not present

## 2020-12-26 DIAGNOSIS — L570 Actinic keratosis: Secondary | ICD-10-CM | POA: Diagnosis not present

## 2020-12-26 DIAGNOSIS — L821 Other seborrheic keratosis: Secondary | ICD-10-CM | POA: Diagnosis not present

## 2020-12-26 DIAGNOSIS — D2271 Melanocytic nevi of right lower limb, including hip: Secondary | ICD-10-CM | POA: Diagnosis not present

## 2020-12-26 DIAGNOSIS — D485 Neoplasm of uncertain behavior of skin: Secondary | ICD-10-CM | POA: Diagnosis not present

## 2020-12-26 DIAGNOSIS — C4441 Basal cell carcinoma of skin of scalp and neck: Secondary | ICD-10-CM | POA: Diagnosis not present

## 2021-01-31 DIAGNOSIS — C44319 Basal cell carcinoma of skin of other parts of face: Secondary | ICD-10-CM | POA: Diagnosis not present

## 2021-04-06 DIAGNOSIS — Z20822 Contact with and (suspected) exposure to covid-19: Secondary | ICD-10-CM | POA: Diagnosis not present

## 2021-04-12 DIAGNOSIS — Z8616 Personal history of COVID-19: Secondary | ICD-10-CM | POA: Diagnosis not present

## 2021-04-12 DIAGNOSIS — Z20822 Contact with and (suspected) exposure to covid-19: Secondary | ICD-10-CM | POA: Diagnosis not present

## 2021-04-12 DIAGNOSIS — Z09 Encounter for follow-up examination after completed treatment for conditions other than malignant neoplasm: Secondary | ICD-10-CM | POA: Diagnosis not present

## 2021-09-04 DIAGNOSIS — I10 Essential (primary) hypertension: Secondary | ICD-10-CM | POA: Diagnosis not present

## 2021-09-04 DIAGNOSIS — G43009 Migraine without aura, not intractable, without status migrainosus: Secondary | ICD-10-CM | POA: Diagnosis not present

## 2021-09-04 DIAGNOSIS — D649 Anemia, unspecified: Secondary | ICD-10-CM | POA: Diagnosis not present

## 2021-11-22 DIAGNOSIS — D225 Melanocytic nevi of trunk: Secondary | ICD-10-CM | POA: Diagnosis not present

## 2021-11-22 DIAGNOSIS — L578 Other skin changes due to chronic exposure to nonionizing radiation: Secondary | ICD-10-CM | POA: Diagnosis not present

## 2021-11-22 DIAGNOSIS — D485 Neoplasm of uncertain behavior of skin: Secondary | ICD-10-CM | POA: Diagnosis not present

## 2021-11-22 DIAGNOSIS — L821 Other seborrheic keratosis: Secondary | ICD-10-CM | POA: Diagnosis not present

## 2021-11-22 DIAGNOSIS — L570 Actinic keratosis: Secondary | ICD-10-CM | POA: Diagnosis not present

## 2022-01-11 DIAGNOSIS — D485 Neoplasm of uncertain behavior of skin: Secondary | ICD-10-CM | POA: Diagnosis not present

## 2022-01-11 DIAGNOSIS — D225 Melanocytic nevi of trunk: Secondary | ICD-10-CM | POA: Diagnosis not present

## 2022-02-01 DIAGNOSIS — Z9884 Bariatric surgery status: Secondary | ICD-10-CM | POA: Diagnosis not present

## 2022-02-01 DIAGNOSIS — G43009 Migraine without aura, not intractable, without status migrainosus: Secondary | ICD-10-CM | POA: Diagnosis not present

## 2022-02-01 DIAGNOSIS — D509 Iron deficiency anemia, unspecified: Secondary | ICD-10-CM | POA: Diagnosis not present

## 2022-02-01 DIAGNOSIS — I10 Essential (primary) hypertension: Secondary | ICD-10-CM | POA: Diagnosis not present

## 2022-02-01 DIAGNOSIS — F5101 Primary insomnia: Secondary | ICD-10-CM | POA: Diagnosis not present

## 2022-06-11 DIAGNOSIS — J208 Acute bronchitis due to other specified organisms: Secondary | ICD-10-CM | POA: Diagnosis not present

## 2022-06-12 DIAGNOSIS — D225 Melanocytic nevi of trunk: Secondary | ICD-10-CM | POA: Diagnosis not present

## 2022-06-12 DIAGNOSIS — L821 Other seborrheic keratosis: Secondary | ICD-10-CM | POA: Diagnosis not present

## 2022-06-12 DIAGNOSIS — L570 Actinic keratosis: Secondary | ICD-10-CM | POA: Diagnosis not present

## 2022-06-12 DIAGNOSIS — L578 Other skin changes due to chronic exposure to nonionizing radiation: Secondary | ICD-10-CM | POA: Diagnosis not present

## 2022-06-12 DIAGNOSIS — D485 Neoplasm of uncertain behavior of skin: Secondary | ICD-10-CM | POA: Diagnosis not present

## 2022-07-11 DIAGNOSIS — D239 Other benign neoplasm of skin, unspecified: Secondary | ICD-10-CM | POA: Diagnosis not present

## 2022-07-11 DIAGNOSIS — D485 Neoplasm of uncertain behavior of skin: Secondary | ICD-10-CM | POA: Diagnosis not present

## 2022-12-18 DIAGNOSIS — L578 Other skin changes due to chronic exposure to nonionizing radiation: Secondary | ICD-10-CM | POA: Diagnosis not present

## 2022-12-18 DIAGNOSIS — L821 Other seborrheic keratosis: Secondary | ICD-10-CM | POA: Diagnosis not present

## 2022-12-18 DIAGNOSIS — C44529 Squamous cell carcinoma of skin of other part of trunk: Secondary | ICD-10-CM | POA: Diagnosis not present

## 2022-12-18 DIAGNOSIS — D485 Neoplasm of uncertain behavior of skin: Secondary | ICD-10-CM | POA: Diagnosis not present

## 2022-12-18 DIAGNOSIS — D225 Melanocytic nevi of trunk: Secondary | ICD-10-CM | POA: Diagnosis not present

## 2023-01-14 DIAGNOSIS — C4442 Squamous cell carcinoma of skin of scalp and neck: Secondary | ICD-10-CM | POA: Diagnosis not present

## 2023-02-06 DIAGNOSIS — Z125 Encounter for screening for malignant neoplasm of prostate: Secondary | ICD-10-CM | POA: Diagnosis not present

## 2023-02-06 DIAGNOSIS — Z0000 Encounter for general adult medical examination without abnormal findings: Secondary | ICD-10-CM | POA: Diagnosis not present

## 2023-02-06 DIAGNOSIS — Z23 Encounter for immunization: Secondary | ICD-10-CM | POA: Diagnosis not present

## 2023-02-06 DIAGNOSIS — D509 Iron deficiency anemia, unspecified: Secondary | ICD-10-CM | POA: Diagnosis not present

## 2023-02-06 DIAGNOSIS — I10 Essential (primary) hypertension: Secondary | ICD-10-CM | POA: Diagnosis not present

## 2023-04-03 DIAGNOSIS — Z130 Encounter for screening for diseases of the blood and blood-forming organs and certain disorders involving the immune mechanism: Secondary | ICD-10-CM | POA: Diagnosis not present

## 2023-04-03 DIAGNOSIS — Z6832 Body mass index (BMI) 32.0-32.9, adult: Secondary | ICD-10-CM | POA: Diagnosis not present

## 2023-04-03 DIAGNOSIS — R635 Abnormal weight gain: Secondary | ICD-10-CM | POA: Diagnosis not present

## 2023-04-03 DIAGNOSIS — E559 Vitamin D deficiency, unspecified: Secondary | ICD-10-CM | POA: Diagnosis not present

## 2023-04-03 DIAGNOSIS — Z125 Encounter for screening for malignant neoplasm of prostate: Secondary | ICD-10-CM | POA: Diagnosis not present

## 2023-04-03 DIAGNOSIS — R7303 Prediabetes: Secondary | ICD-10-CM | POA: Diagnosis not present

## 2023-04-03 DIAGNOSIS — Z136 Encounter for screening for cardiovascular disorders: Secondary | ICD-10-CM | POA: Diagnosis not present

## 2023-04-03 DIAGNOSIS — Z1329 Encounter for screening for other suspected endocrine disorder: Secondary | ICD-10-CM | POA: Diagnosis not present

## 2023-04-15 DIAGNOSIS — R7303 Prediabetes: Secondary | ICD-10-CM | POA: Diagnosis not present

## 2023-04-15 DIAGNOSIS — Z1331 Encounter for screening for depression: Secondary | ICD-10-CM | POA: Diagnosis not present

## 2023-04-15 DIAGNOSIS — E669 Obesity, unspecified: Secondary | ICD-10-CM | POA: Diagnosis not present

## 2023-04-15 DIAGNOSIS — E559 Vitamin D deficiency, unspecified: Secondary | ICD-10-CM | POA: Diagnosis not present

## 2023-04-15 DIAGNOSIS — I10 Essential (primary) hypertension: Secondary | ICD-10-CM | POA: Diagnosis not present

## 2023-04-15 DIAGNOSIS — E611 Iron deficiency: Secondary | ICD-10-CM | POA: Diagnosis not present

## 2023-04-22 DIAGNOSIS — D125 Benign neoplasm of sigmoid colon: Secondary | ICD-10-CM | POA: Diagnosis not present

## 2023-04-22 DIAGNOSIS — K648 Other hemorrhoids: Secondary | ICD-10-CM | POA: Diagnosis not present

## 2023-04-22 DIAGNOSIS — K635 Polyp of colon: Secondary | ICD-10-CM | POA: Diagnosis not present

## 2023-04-22 DIAGNOSIS — D122 Benign neoplasm of ascending colon: Secondary | ICD-10-CM | POA: Diagnosis not present

## 2023-04-22 DIAGNOSIS — Z1211 Encounter for screening for malignant neoplasm of colon: Secondary | ICD-10-CM | POA: Diagnosis not present

## 2023-05-01 DIAGNOSIS — E291 Testicular hypofunction: Secondary | ICD-10-CM | POA: Diagnosis not present

## 2023-05-01 DIAGNOSIS — E611 Iron deficiency: Secondary | ICD-10-CM | POA: Diagnosis not present

## 2023-05-01 DIAGNOSIS — Z6833 Body mass index (BMI) 33.0-33.9, adult: Secondary | ICD-10-CM | POA: Diagnosis not present

## 2023-05-08 DIAGNOSIS — E291 Testicular hypofunction: Secondary | ICD-10-CM | POA: Diagnosis not present

## 2023-05-15 DIAGNOSIS — E611 Iron deficiency: Secondary | ICD-10-CM | POA: Diagnosis not present

## 2023-05-15 DIAGNOSIS — Z6832 Body mass index (BMI) 32.0-32.9, adult: Secondary | ICD-10-CM | POA: Diagnosis not present

## 2023-05-15 DIAGNOSIS — R7303 Prediabetes: Secondary | ICD-10-CM | POA: Diagnosis not present

## 2023-05-15 DIAGNOSIS — Z79890 Hormone replacement therapy (postmenopausal): Secondary | ICD-10-CM | POA: Diagnosis not present

## 2023-05-15 DIAGNOSIS — E291 Testicular hypofunction: Secondary | ICD-10-CM | POA: Diagnosis not present

## 2023-05-20 DIAGNOSIS — I10 Essential (primary) hypertension: Secondary | ICD-10-CM | POA: Diagnosis not present

## 2023-05-20 DIAGNOSIS — Z6832 Body mass index (BMI) 32.0-32.9, adult: Secondary | ICD-10-CM | POA: Diagnosis not present

## 2023-05-20 DIAGNOSIS — E291 Testicular hypofunction: Secondary | ICD-10-CM | POA: Diagnosis not present

## 2023-05-29 DIAGNOSIS — E291 Testicular hypofunction: Secondary | ICD-10-CM | POA: Diagnosis not present

## 2023-06-05 DIAGNOSIS — Z6831 Body mass index (BMI) 31.0-31.9, adult: Secondary | ICD-10-CM | POA: Diagnosis not present

## 2023-06-05 DIAGNOSIS — E291 Testicular hypofunction: Secondary | ICD-10-CM | POA: Diagnosis not present

## 2023-06-05 DIAGNOSIS — R5382 Chronic fatigue, unspecified: Secondary | ICD-10-CM | POA: Diagnosis not present

## 2023-06-11 DIAGNOSIS — E559 Vitamin D deficiency, unspecified: Secondary | ICD-10-CM | POA: Diagnosis not present

## 2023-06-11 DIAGNOSIS — Z6831 Body mass index (BMI) 31.0-31.9, adult: Secondary | ICD-10-CM | POA: Diagnosis not present

## 2023-06-11 DIAGNOSIS — E291 Testicular hypofunction: Secondary | ICD-10-CM | POA: Diagnosis not present

## 2023-06-18 DIAGNOSIS — L570 Actinic keratosis: Secondary | ICD-10-CM | POA: Diagnosis not present

## 2023-06-18 DIAGNOSIS — D485 Neoplasm of uncertain behavior of skin: Secondary | ICD-10-CM | POA: Diagnosis not present

## 2023-06-18 DIAGNOSIS — L821 Other seborrheic keratosis: Secondary | ICD-10-CM | POA: Diagnosis not present

## 2023-06-18 DIAGNOSIS — D225 Melanocytic nevi of trunk: Secondary | ICD-10-CM | POA: Diagnosis not present

## 2023-06-18 DIAGNOSIS — L578 Other skin changes due to chronic exposure to nonionizing radiation: Secondary | ICD-10-CM | POA: Diagnosis not present

## 2023-06-18 DIAGNOSIS — C44519 Basal cell carcinoma of skin of other part of trunk: Secondary | ICD-10-CM | POA: Diagnosis not present

## 2023-06-19 DIAGNOSIS — Z6831 Body mass index (BMI) 31.0-31.9, adult: Secondary | ICD-10-CM | POA: Diagnosis not present

## 2023-06-19 DIAGNOSIS — E291 Testicular hypofunction: Secondary | ICD-10-CM | POA: Diagnosis not present

## 2023-06-19 DIAGNOSIS — I10 Essential (primary) hypertension: Secondary | ICD-10-CM | POA: Diagnosis not present

## 2023-07-07 DIAGNOSIS — Z6831 Body mass index (BMI) 31.0-31.9, adult: Secondary | ICD-10-CM | POA: Diagnosis not present

## 2023-07-07 DIAGNOSIS — I10 Essential (primary) hypertension: Secondary | ICD-10-CM | POA: Diagnosis not present

## 2023-07-15 DIAGNOSIS — E291 Testicular hypofunction: Secondary | ICD-10-CM | POA: Diagnosis not present

## 2023-07-16 DIAGNOSIS — C44519 Basal cell carcinoma of skin of other part of trunk: Secondary | ICD-10-CM | POA: Diagnosis not present

## 2023-07-18 DIAGNOSIS — R7989 Other specified abnormal findings of blood chemistry: Secondary | ICD-10-CM | POA: Diagnosis not present

## 2023-07-18 DIAGNOSIS — I10 Essential (primary) hypertension: Secondary | ICD-10-CM | POA: Diagnosis not present

## 2023-07-18 DIAGNOSIS — Z23 Encounter for immunization: Secondary | ICD-10-CM | POA: Diagnosis not present

## 2023-07-18 DIAGNOSIS — F5101 Primary insomnia: Secondary | ICD-10-CM | POA: Diagnosis not present

## 2023-07-18 DIAGNOSIS — R739 Hyperglycemia, unspecified: Secondary | ICD-10-CM | POA: Diagnosis not present

## 2023-07-18 DIAGNOSIS — D509 Iron deficiency anemia, unspecified: Secondary | ICD-10-CM | POA: Diagnosis not present

## 2023-07-18 DIAGNOSIS — R946 Abnormal results of thyroid function studies: Secondary | ICD-10-CM | POA: Diagnosis not present

## 2023-07-18 DIAGNOSIS — G43009 Migraine without aura, not intractable, without status migrainosus: Secondary | ICD-10-CM | POA: Diagnosis not present

## 2023-07-22 DIAGNOSIS — E291 Testicular hypofunction: Secondary | ICD-10-CM | POA: Diagnosis not present

## 2023-07-29 DIAGNOSIS — R5382 Chronic fatigue, unspecified: Secondary | ICD-10-CM | POA: Diagnosis not present

## 2023-07-29 DIAGNOSIS — E291 Testicular hypofunction: Secondary | ICD-10-CM | POA: Diagnosis not present

## 2023-07-29 DIAGNOSIS — Z6829 Body mass index (BMI) 29.0-29.9, adult: Secondary | ICD-10-CM | POA: Diagnosis not present

## 2023-08-14 DIAGNOSIS — E291 Testicular hypofunction: Secondary | ICD-10-CM | POA: Diagnosis not present

## 2023-08-14 DIAGNOSIS — Z6830 Body mass index (BMI) 30.0-30.9, adult: Secondary | ICD-10-CM | POA: Diagnosis not present

## 2023-08-14 DIAGNOSIS — I10 Essential (primary) hypertension: Secondary | ICD-10-CM | POA: Diagnosis not present

## 2023-08-19 DIAGNOSIS — Z79890 Hormone replacement therapy (postmenopausal): Secondary | ICD-10-CM | POA: Diagnosis not present

## 2023-08-19 DIAGNOSIS — E611 Iron deficiency: Secondary | ICD-10-CM | POA: Diagnosis not present

## 2023-08-19 DIAGNOSIS — R7303 Prediabetes: Secondary | ICD-10-CM | POA: Diagnosis not present

## 2023-08-19 DIAGNOSIS — E559 Vitamin D deficiency, unspecified: Secondary | ICD-10-CM | POA: Diagnosis not present

## 2023-08-19 DIAGNOSIS — R5382 Chronic fatigue, unspecified: Secondary | ICD-10-CM | POA: Diagnosis not present

## 2023-08-19 DIAGNOSIS — E291 Testicular hypofunction: Secondary | ICD-10-CM | POA: Diagnosis not present

## 2023-08-19 DIAGNOSIS — I10 Essential (primary) hypertension: Secondary | ICD-10-CM | POA: Diagnosis not present

## 2023-08-21 DIAGNOSIS — R7303 Prediabetes: Secondary | ICD-10-CM | POA: Diagnosis not present

## 2023-08-21 DIAGNOSIS — E559 Vitamin D deficiency, unspecified: Secondary | ICD-10-CM | POA: Diagnosis not present

## 2023-08-21 DIAGNOSIS — E291 Testicular hypofunction: Secondary | ICD-10-CM | POA: Diagnosis not present

## 2023-08-21 DIAGNOSIS — I10 Essential (primary) hypertension: Secondary | ICD-10-CM | POA: Diagnosis not present

## 2023-08-29 DIAGNOSIS — Z6830 Body mass index (BMI) 30.0-30.9, adult: Secondary | ICD-10-CM | POA: Diagnosis not present

## 2023-08-29 DIAGNOSIS — R7303 Prediabetes: Secondary | ICD-10-CM | POA: Diagnosis not present

## 2023-08-29 DIAGNOSIS — E291 Testicular hypofunction: Secondary | ICD-10-CM | POA: Diagnosis not present
